# Patient Record
Sex: Male | Born: 1980 | Race: Black or African American | Hispanic: No | Marital: Married | Smoking: Former smoker
Health system: Southern US, Community
[De-identification: ages and names within clinical notes are randomized; demographics above are authoritative.]

## PROBLEM LIST (undated history)

## (undated) DIAGNOSIS — J189 Pneumonia, unspecified organism: Secondary | ICD-10-CM

## (undated) DIAGNOSIS — G4730 Sleep apnea, unspecified: Secondary | ICD-10-CM

## (undated) DIAGNOSIS — R011 Cardiac murmur, unspecified: Secondary | ICD-10-CM

## (undated) DIAGNOSIS — I10 Essential (primary) hypertension: Secondary | ICD-10-CM

## (undated) DIAGNOSIS — E669 Obesity, unspecified: Secondary | ICD-10-CM

## (undated) DIAGNOSIS — Z87891 Personal history of nicotine dependence: Secondary | ICD-10-CM

## (undated) DIAGNOSIS — J45909 Unspecified asthma, uncomplicated: Secondary | ICD-10-CM

## (undated) DIAGNOSIS — M1990 Unspecified osteoarthritis, unspecified site: Secondary | ICD-10-CM

## (undated) HISTORY — PX: NO PAST SURGERIES: SHX2092

---

## 2018-07-17 DIAGNOSIS — H5213 Myopia, bilateral: Secondary | ICD-10-CM | POA: Diagnosis not present

## 2018-12-26 DIAGNOSIS — I10 Essential (primary) hypertension: Secondary | ICD-10-CM | POA: Diagnosis not present

## 2018-12-26 DIAGNOSIS — M79604 Pain in right leg: Secondary | ICD-10-CM | POA: Diagnosis not present

## 2018-12-26 DIAGNOSIS — F17210 Nicotine dependence, cigarettes, uncomplicated: Secondary | ICD-10-CM | POA: Diagnosis not present

## 2018-12-26 DIAGNOSIS — R2241 Localized swelling, mass and lump, right lower limb: Secondary | ICD-10-CM | POA: Diagnosis not present

## 2018-12-26 DIAGNOSIS — Z79899 Other long term (current) drug therapy: Secondary | ICD-10-CM | POA: Diagnosis not present

## 2019-07-24 DIAGNOSIS — H5213 Myopia, bilateral: Secondary | ICD-10-CM | POA: Diagnosis not present

## 2019-12-17 ENCOUNTER — Other Ambulatory Visit: Payer: Self-pay | Admitting: Physician Assistant

## 2019-12-17 ENCOUNTER — Ambulatory Visit
Admission: RE | Admit: 2019-12-17 | Discharge: 2019-12-17 | Disposition: A | Discharge status: Home / Self Care | Payer: 59 | Source: Ambulatory Visit | Attending: Physician Assistant | Admitting: Physician Assistant

## 2019-12-17 DIAGNOSIS — M25562 Pain in left knee: Secondary | ICD-10-CM

## 2019-12-17 DIAGNOSIS — I10 Essential (primary) hypertension: Secondary | ICD-10-CM | POA: Diagnosis not present

## 2019-12-17 DIAGNOSIS — G4730 Sleep apnea, unspecified: Secondary | ICD-10-CM | POA: Diagnosis not present

## 2019-12-17 DIAGNOSIS — M25561 Pain in right knee: Secondary | ICD-10-CM | POA: Diagnosis not present

## 2019-12-17 DIAGNOSIS — M1711 Unilateral primary osteoarthritis, right knee: Secondary | ICD-10-CM | POA: Diagnosis not present

## 2019-12-17 DIAGNOSIS — Z720 Tobacco use: Secondary | ICD-10-CM | POA: Diagnosis not present

## 2019-12-17 DIAGNOSIS — K529 Noninfective gastroenteritis and colitis, unspecified: Secondary | ICD-10-CM | POA: Diagnosis not present

## 2019-12-17 DIAGNOSIS — R7303 Prediabetes: Secondary | ICD-10-CM | POA: Diagnosis not present

## 2019-12-17 DIAGNOSIS — G8929 Other chronic pain: Secondary | ICD-10-CM | POA: Diagnosis not present

## 2019-12-27 DIAGNOSIS — M25551 Pain in right hip: Secondary | ICD-10-CM | POA: Diagnosis not present

## 2019-12-27 DIAGNOSIS — M25561 Pain in right knee: Secondary | ICD-10-CM | POA: Diagnosis not present

## 2020-01-02 DIAGNOSIS — M25561 Pain in right knee: Secondary | ICD-10-CM | POA: Diagnosis not present

## 2020-01-02 DIAGNOSIS — M25551 Pain in right hip: Secondary | ICD-10-CM | POA: Diagnosis not present

## 2020-01-09 DIAGNOSIS — M25551 Pain in right hip: Secondary | ICD-10-CM | POA: Diagnosis not present

## 2020-01-09 DIAGNOSIS — M25561 Pain in right knee: Secondary | ICD-10-CM | POA: Diagnosis not present

## 2020-01-09 DIAGNOSIS — G4733 Obstructive sleep apnea (adult) (pediatric): Secondary | ICD-10-CM | POA: Diagnosis not present

## 2020-01-15 DIAGNOSIS — F17200 Nicotine dependence, unspecified, uncomplicated: Secondary | ICD-10-CM | POA: Diagnosis not present

## 2020-01-15 DIAGNOSIS — Z1389 Encounter for screening for other disorder: Secondary | ICD-10-CM | POA: Diagnosis not present

## 2020-01-15 DIAGNOSIS — I10 Essential (primary) hypertension: Secondary | ICD-10-CM | POA: Diagnosis not present

## 2020-01-15 DIAGNOSIS — K580 Irritable bowel syndrome with diarrhea: Secondary | ICD-10-CM | POA: Diagnosis not present

## 2020-01-15 DIAGNOSIS — G4730 Sleep apnea, unspecified: Secondary | ICD-10-CM | POA: Diagnosis not present

## 2020-01-15 DIAGNOSIS — M1711 Unilateral primary osteoarthritis, right knee: Secondary | ICD-10-CM | POA: Diagnosis not present

## 2020-01-16 DIAGNOSIS — M25561 Pain in right knee: Secondary | ICD-10-CM | POA: Diagnosis not present

## 2020-01-16 DIAGNOSIS — M25551 Pain in right hip: Secondary | ICD-10-CM | POA: Diagnosis not present

## 2020-01-21 DIAGNOSIS — G4733 Obstructive sleep apnea (adult) (pediatric): Secondary | ICD-10-CM | POA: Diagnosis not present

## 2020-01-21 DIAGNOSIS — I10 Essential (primary) hypertension: Secondary | ICD-10-CM | POA: Diagnosis not present

## 2020-02-18 DIAGNOSIS — F17200 Nicotine dependence, unspecified, uncomplicated: Secondary | ICD-10-CM | POA: Diagnosis not present

## 2020-02-18 DIAGNOSIS — I10 Essential (primary) hypertension: Secondary | ICD-10-CM | POA: Diagnosis not present

## 2020-12-15 ENCOUNTER — Other Ambulatory Visit (HOSPITAL_COMMUNITY): Payer: Self-pay | Admitting: Physician Assistant

## 2020-12-15 DIAGNOSIS — F17200 Nicotine dependence, unspecified, uncomplicated: Secondary | ICD-10-CM | POA: Diagnosis not present

## 2020-12-15 DIAGNOSIS — N529 Male erectile dysfunction, unspecified: Secondary | ICD-10-CM | POA: Diagnosis not present

## 2020-12-15 DIAGNOSIS — I10 Essential (primary) hypertension: Secondary | ICD-10-CM | POA: Diagnosis not present

## 2020-12-15 DIAGNOSIS — K580 Irritable bowel syndrome with diarrhea: Secondary | ICD-10-CM | POA: Diagnosis not present

## 2020-12-15 DIAGNOSIS — G4730 Sleep apnea, unspecified: Secondary | ICD-10-CM | POA: Diagnosis not present

## 2021-01-27 IMAGING — DX DG KNEE COMPLETE 4+V*R*
4 series · 4 of 4 positions shown · non-contrast
Comparison: None.

CLINICAL DATA: Right knee pain for 2 weeks

EXAM:
RIGHT KNEE - COMPLETE 4+ VIEW

[dg knee complete 4 views right (1 of 4)]
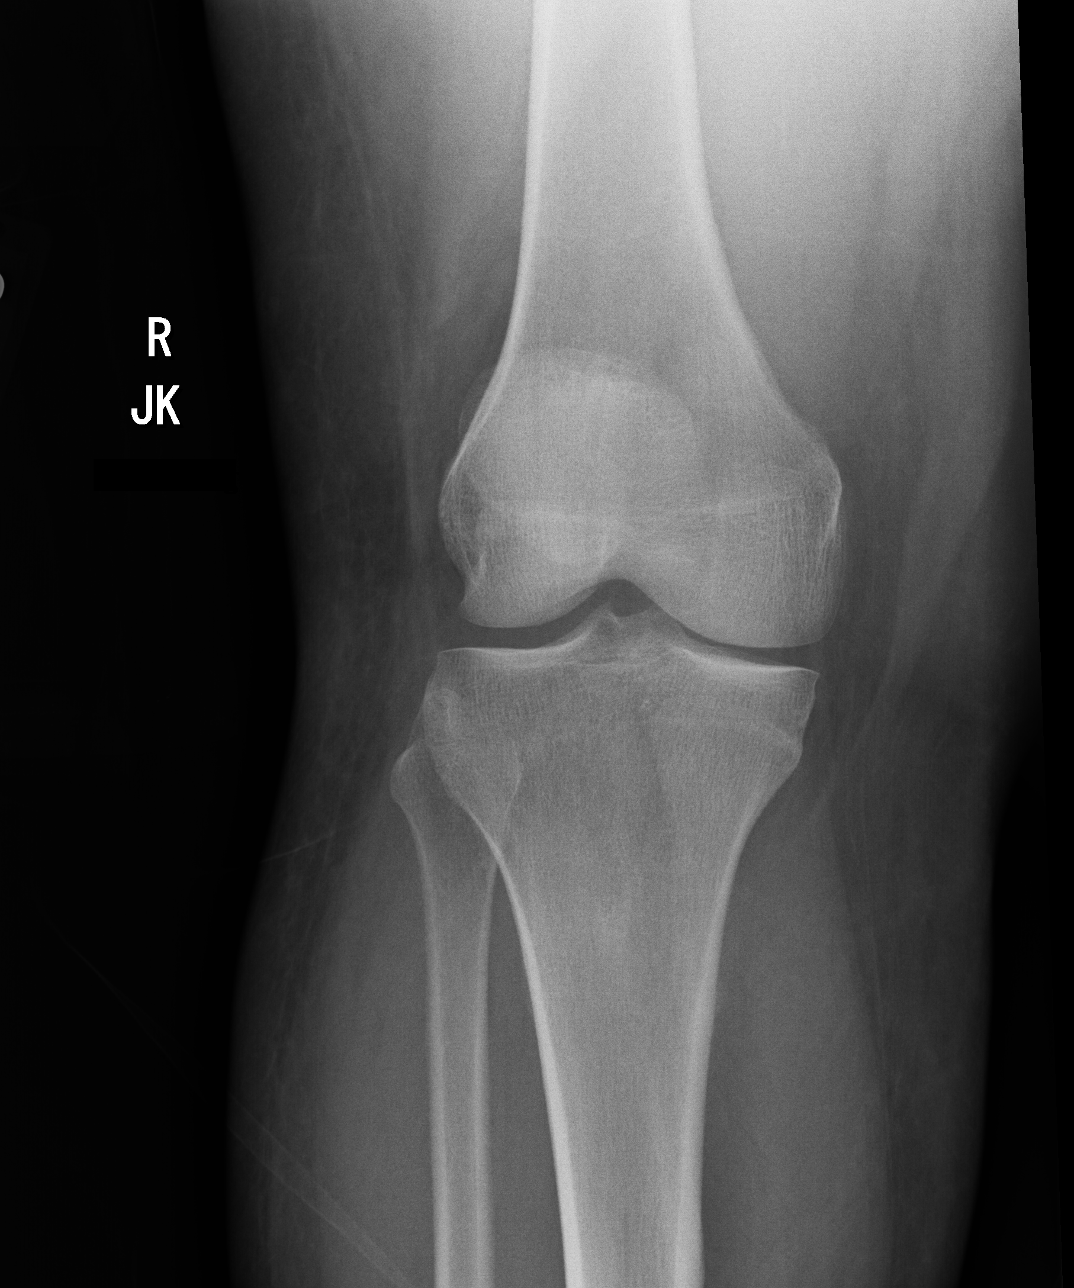

[dg knee complete 4 views right (2 of 4)]
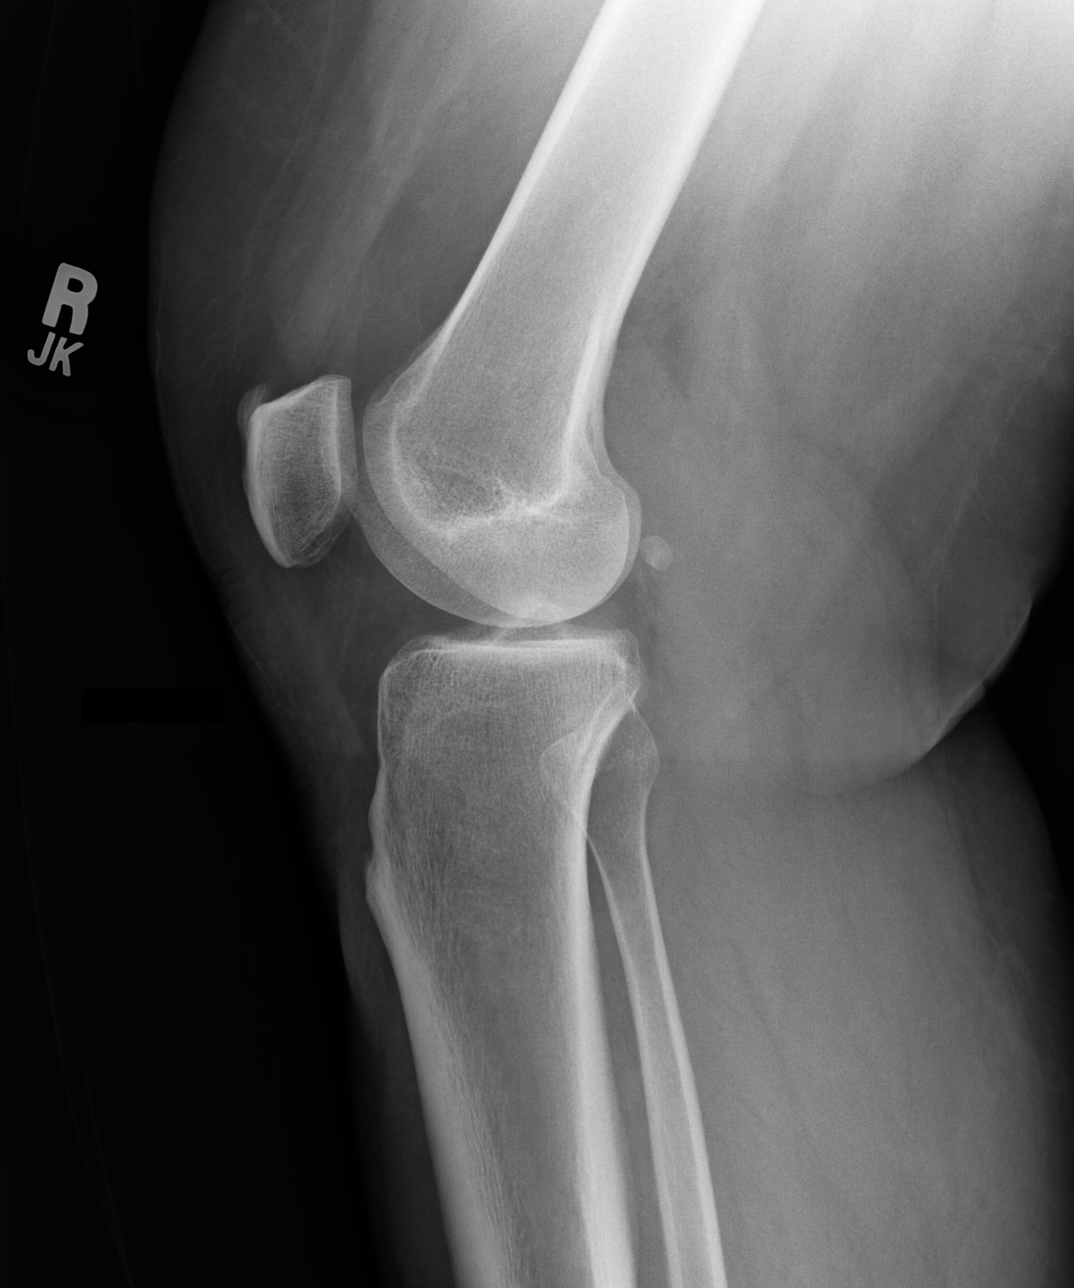

[dg knee complete 4 views right (3 of 4)]
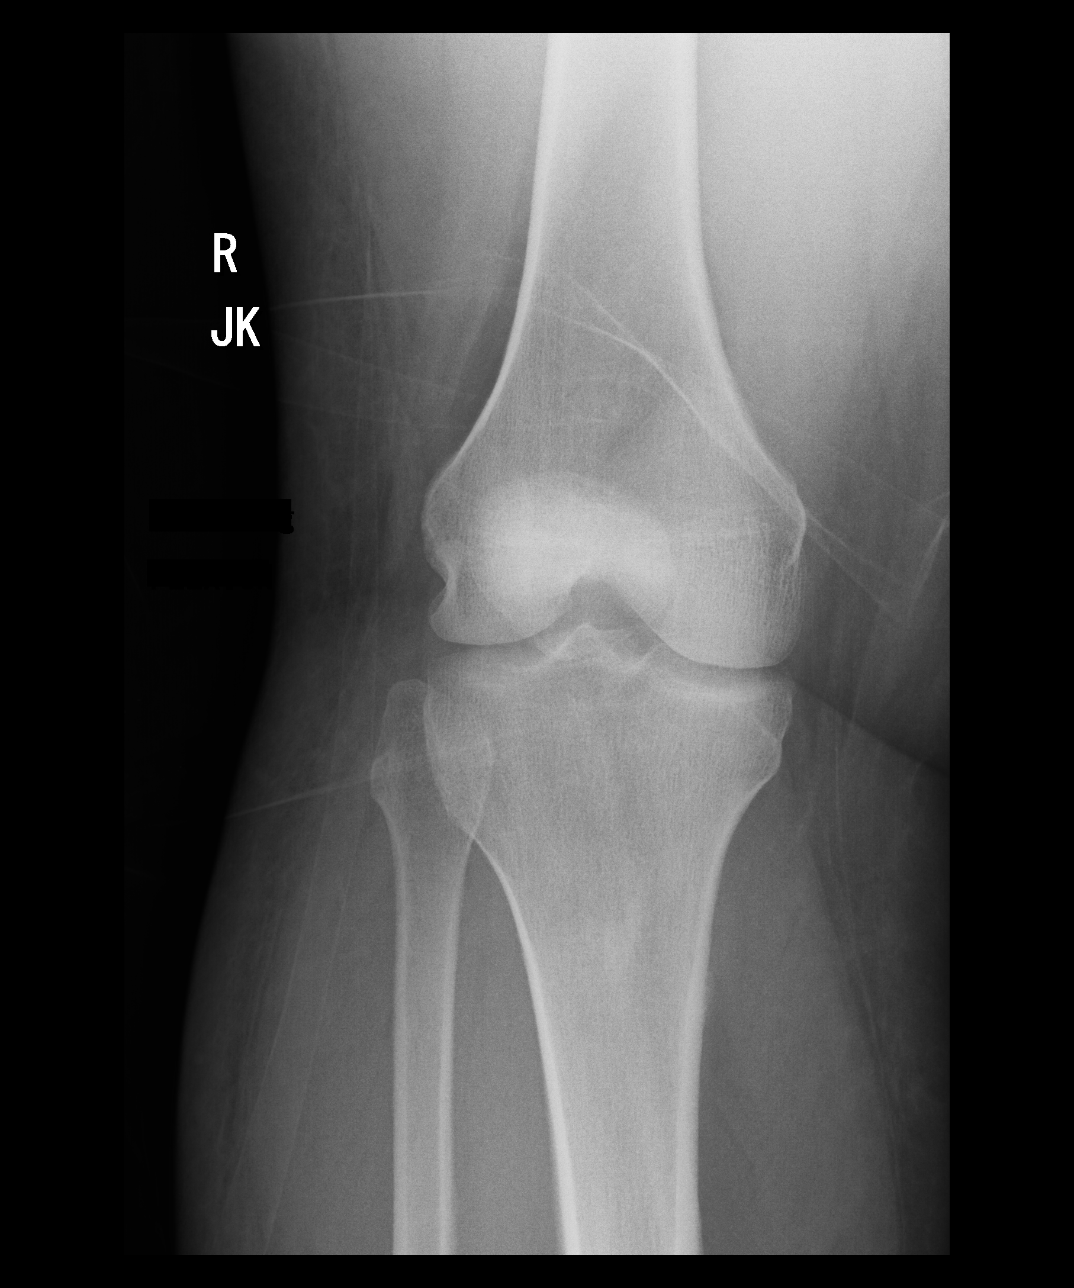

[dg knee complete 4 views right (4 of 4)]
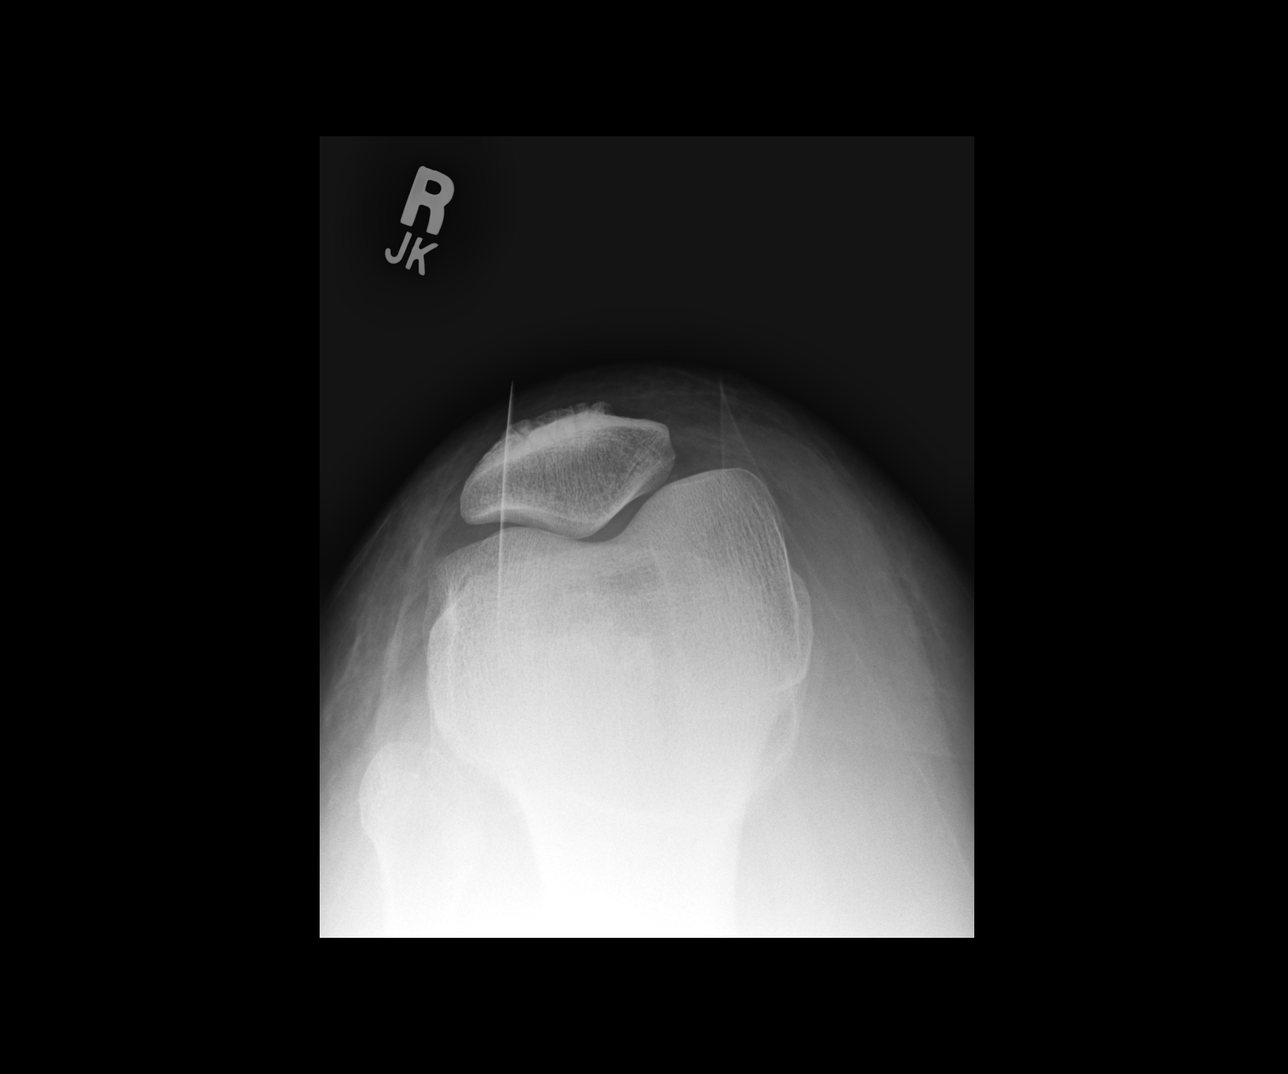

[4 of 4 positions shown; findings below may reference images not displayed]

FINDINGS: Frontal, oblique, lateral, and sunrise views of the right knee are
obtained. No fracture, subluxation, or dislocation. Mild medial
compartmental joint space narrowing. No joint effusion.
IMPRESSION: 1. Mild medial compartmental osteoarthritis.

## 2021-04-09 DIAGNOSIS — G4733 Obstructive sleep apnea (adult) (pediatric): Secondary | ICD-10-CM | POA: Diagnosis not present

## 2022-02-28 ENCOUNTER — Encounter (HOSPITAL_COMMUNITY): Payer: Self-pay

## 2022-02-28 ENCOUNTER — Ambulatory Visit (INDEPENDENT_AMBULATORY_CARE_PROVIDER_SITE_OTHER): Payer: Self-pay

## 2022-02-28 ENCOUNTER — Ambulatory Visit (HOSPITAL_COMMUNITY)
Admission: EM | Admit: 2022-02-28 | Discharge: 2022-02-28 | Disposition: A | Discharge status: Home / Self Care | Payer: Self-pay | Attending: Physician Assistant | Admitting: Physician Assistant

## 2022-02-28 DIAGNOSIS — M545 Low back pain, unspecified: Secondary | ICD-10-CM

## 2022-02-28 DIAGNOSIS — I10 Essential (primary) hypertension: Secondary | ICD-10-CM

## 2022-02-28 MED ORDER — HYDROCHLOROTHIAZIDE 25 MG PO TABS: ORAL_TABLET | ORAL | 1 refills | Status: DC

## 2022-02-28 MED ORDER — TIZANIDINE HCL 4 MG PO TABS: 4.0000 mg | ORAL_TABLET | Freq: Three times a day (TID) | ORAL | 0 refills | Status: DC | PRN
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
## 2022-05-12 ENCOUNTER — Inpatient Hospital Stay (HOSPITAL_BASED_OUTPATIENT_CLINIC_OR_DEPARTMENT_OTHER)
Admission: EM | Admit: 2022-05-12 | Discharge: 2022-05-14 | DRG: 872 | Disposition: A | Discharge status: Home / Self Care | Payer: PRIVATE HEALTH INSURANCE | Attending: Internal Medicine | Admitting: Internal Medicine

## 2022-05-12 ENCOUNTER — Emergency Department (HOSPITAL_BASED_OUTPATIENT_CLINIC_OR_DEPARTMENT_OTHER): Payer: PRIVATE HEALTH INSURANCE

## 2022-05-12 ENCOUNTER — Other Ambulatory Visit: Payer: Self-pay

## 2022-05-12 ENCOUNTER — Encounter (HOSPITAL_BASED_OUTPATIENT_CLINIC_OR_DEPARTMENT_OTHER): Payer: Self-pay | Admitting: Emergency Medicine

## 2022-05-12 DIAGNOSIS — M1990 Unspecified osteoarthritis, unspecified site: Secondary | ICD-10-CM | POA: Diagnosis present

## 2022-05-12 DIAGNOSIS — K612 Anorectal abscess: Secondary | ICD-10-CM | POA: Diagnosis present

## 2022-05-12 DIAGNOSIS — G4733 Obstructive sleep apnea (adult) (pediatric): Secondary | ICD-10-CM | POA: Diagnosis present

## 2022-05-12 DIAGNOSIS — E876 Hypokalemia: Secondary | ICD-10-CM | POA: Diagnosis present

## 2022-05-12 DIAGNOSIS — K610 Anal abscess: Secondary | ICD-10-CM | POA: Diagnosis present

## 2022-05-12 DIAGNOSIS — Z79899 Other long term (current) drug therapy: Secondary | ICD-10-CM | POA: Diagnosis not present

## 2022-05-12 DIAGNOSIS — I10 Essential (primary) hypertension: Secondary | ICD-10-CM | POA: Diagnosis present

## 2022-05-12 DIAGNOSIS — Z6841 Body Mass Index (BMI) 40.0 and over, adult: Secondary | ICD-10-CM

## 2022-05-12 DIAGNOSIS — K6289 Other specified diseases of anus and rectum: Secondary | ICD-10-CM | POA: Diagnosis not present

## 2022-05-12 DIAGNOSIS — A419 Sepsis, unspecified organism: Secondary | ICD-10-CM | POA: Diagnosis present

## 2022-05-12 DIAGNOSIS — R7303 Prediabetes: Secondary | ICD-10-CM | POA: Diagnosis present

## 2022-05-12 DIAGNOSIS — F17210 Nicotine dependence, cigarettes, uncomplicated: Secondary | ICD-10-CM | POA: Diagnosis present

## 2022-05-12 HISTORY — DX: Personal history of nicotine dependence: Z87.891

## 2022-05-12 HISTORY — DX: Unspecified osteoarthritis, unspecified site: M19.90

## 2022-05-12 HISTORY — DX: Obesity, unspecified: E66.9

## 2022-05-12 HISTORY — DX: Sleep apnea, unspecified: G47.30

## 2022-05-12 HISTORY — DX: Essential (primary) hypertension: I10

## 2022-05-12 LAB — CBC WITH DIFFERENTIAL/PLATELET
Abs Immature Granulocytes: 0.1 10*3/uL — ABNORMAL HIGH (ref 0.00–0.07)
Basophils Absolute: 0 10*3/uL (ref 0.0–0.1)
Basophils Relative: 0 %
Eosinophils Absolute: 0 10*3/uL (ref 0.0–0.5)
Eosinophils Relative: 0 %
HCT: 44.3 % (ref 39.0–52.0)
Hemoglobin: 14.8 g/dL (ref 13.0–17.0)
Immature Granulocytes: 1 %
Lymphocytes Relative: 13 %
Lymphs Abs: 2.2 10*3/uL (ref 0.7–4.0)
MCH: 30.1 pg (ref 26.0–34.0)
MCHC: 33.4 g/dL (ref 30.0–36.0)
MCV: 90.2 fL (ref 80.0–100.0)
Monocytes Absolute: 2.2 10*3/uL — ABNORMAL HIGH (ref 0.1–1.0)
Monocytes Relative: 13 %
Neutro Abs: 11.7 10*3/uL — ABNORMAL HIGH (ref 1.7–7.7)
Neutrophils Relative %: 73 %
Platelets: 353 10*3/uL (ref 150–400)
RBC: 4.91 MIL/uL (ref 4.22–5.81)
RDW: 13.5 % (ref 11.5–15.5)
WBC: 16.3 10*3/uL — ABNORMAL HIGH (ref 4.0–10.5)
nRBC: 0 % (ref 0.0–0.2)

## 2022-05-12 LAB — COMPREHENSIVE METABOLIC PANEL
ALT: 33 U/L (ref 0–44)
AST: 17 U/L (ref 15–41)
Albumin: 4.5 g/dL (ref 3.5–5.0)
Alkaline Phosphatase: 74 U/L (ref 38–126)
Anion gap: 12 (ref 5–15)
BUN: 9 mg/dL (ref 6–20)
CO2: 27 mmol/L (ref 22–32)
Calcium: 9.8 mg/dL (ref 8.9–10.3)
Chloride: 100 mmol/L (ref 98–111)
Creatinine, Ser: 0.74 mg/dL (ref 0.61–1.24)
GFR, Estimated: >60 mL/min (ref >60)
Glucose, Bld: 98 mg/dL (ref 70–99)
Potassium: 3.1 mmol/L — ABNORMAL LOW (ref 3.5–5.1)
Sodium: 139 mmol/L (ref 135–145)
Total Bilirubin: 0.5 mg/dL (ref 0.3–1.2)
Total Protein: 8.3 g/dL — ABNORMAL HIGH (ref 6.5–8.1)

## 2022-05-12 LAB — LACTIC ACID, PLASMA: Lactic Acid, Venous: 0.9 mmol/L (ref 0.5–1.9)

## 2022-05-12 MED ORDER — SODIUM CHLORIDE 0.9 % IV BOLUS
500.0000 mL | Freq: Once | INTRAVENOUS | Status: AC
  Administered 2022-05-12: 500 mL via INTRAVENOUS

## 2022-05-12 MED ORDER — LIDOCAINE-EPINEPHRINE (PF) 2 %-1:200000 IJ SOLN
20.0000 mL | Freq: Once | INTRAMUSCULAR | Status: AC
  Administered 2022-05-12: 20 mL
  Filled 2022-05-12: qty 20

## 2022-05-12 MED ORDER — SODIUM CHLORIDE 0.9 % IV SOLN
INTRAVENOUS | Status: DC | PRN
  Administered 2022-05-12: 10 mL/h via INTRAVENOUS

## 2022-05-12 MED ORDER — ONDANSETRON HCL 4 MG/2ML IJ SOLN
4.0000 mg | Freq: Once | INTRAMUSCULAR | Status: AC
  Administered 2022-05-12: 4 mg via INTRAVENOUS
  Filled 2022-05-12: qty 2

## 2022-05-12 MED ORDER — IOHEXOL 300 MG/ML  SOLN
100.0000 mL | Freq: Once | INTRAMUSCULAR | Status: AC | PRN
  Administered 2022-05-12: 100 mL via INTRAVENOUS

## 2022-05-12 MED ORDER — PIPERACILLIN-TAZOBACTAM 3.375 G IVPB
3.3750 g | Freq: Three times a day (TID) | INTRAVENOUS | Status: DC
  Administered 2022-05-12 – 2022-05-14 (×5): 3.375 g via INTRAVENOUS
  Filled 2022-05-12 (×5): qty 50

## 2022-05-12 MED ORDER — ONDANSETRON HCL 4 MG/2ML IJ SOLN: 4.0000 mg | Freq: Four times a day (QID) | INTRAMUSCULAR | Status: DC | PRN

## 2022-05-12 MED ORDER — ONDANSETRON HCL 4 MG PO TABS: 4.0000 mg | ORAL_TABLET | Freq: Four times a day (QID) | ORAL | Status: DC | PRN

## 2022-05-12 MED ORDER — MORPHINE SULFATE (PF) 4 MG/ML IV SOLN
4.0000 mg | Freq: Once | INTRAVENOUS | Status: AC
  Administered 2022-05-12: 4 mg via INTRAVENOUS
  Filled 2022-05-12: qty 1

## 2022-05-12 MED ORDER — IOHEXOL 350 MG/ML SOLN
100.0000 mL | Freq: Once | INTRAVENOUS | Status: DC | PRN
Start: 2022-05-12 — End: 2022-05-12

## 2022-05-12 MED ORDER — LACTATED RINGERS IV SOLN: INTRAVENOUS | Status: DC

## 2022-05-12 MED ORDER — ACETAMINOPHEN 650 MG RE SUPP: 650.0000 mg | Freq: Four times a day (QID) | RECTAL | Status: DC | PRN

## 2022-05-12 MED ORDER — ACETAMINOPHEN 325 MG PO TABS: 650.0000 mg | ORAL_TABLET | Freq: Four times a day (QID) | ORAL | Status: DC | PRN

## 2022-05-12 MED ORDER — POTASSIUM CHLORIDE 10 MEQ/100ML IV SOLN
10.0000 meq | INTRAVENOUS | Status: AC
  Administered 2022-05-12 (×3): 10 meq via INTRAVENOUS
  Filled 2022-05-12 (×3): qty 100

## 2022-05-12 MED ORDER — PIPERACILLIN-TAZOBACTAM 3.375 G IVPB 30 MIN
3.3750 g | Freq: Once | INTRAVENOUS | Status: AC
Start: 2022-05-12 — End: 2022-05-12
  Administered 2022-05-12: 3.375 g via INTRAVENOUS
  Filled 2022-05-12: qty 50

## 2022-05-12 MED ORDER — ENOXAPARIN SODIUM 40 MG/0.4ML IJ SOSY
40.0000 mg | PREFILLED_SYRINGE | INTRAMUSCULAR | Status: DC
  Administered 2022-05-13: 40 mg via SUBCUTANEOUS
  Filled 2022-05-12: qty 0.4

## 2022-05-12 NOTE — Assessment & Plan Note (Signed)
BPs 110s systolic currently. Hold home BPs for the moment.

## 2022-05-12 NOTE — Progress Notes (Signed)
Patient arrived from [place]. Patient is resting well and pain in controlled.

## 2022-05-12 NOTE — ED Provider Notes (Signed)
Emergency Department Provider Note   I have reviewed the triage vital signs and the nursing notes.   HISTORY  Chief Complaint Abscess   HPI Johnathan Lee is a 41 y.o. male presents emergency department for evaluation and concern for perirectal abscess from his PCP.  Patient tells me has had discomfort in the rectal area for the past 3 weeks.  He assumed this was a hemorrhoid and he been treating with over-the-counter medications and overall felt like he was improving until the last couple of days when he developed sudden worsening pain, inflammation, drainage.  No bleeding.  He continues to have bowel movements.  He had subjective fevers and chills.  He went to his PCP who evaluated and had concern for perirectal abscess.  He was unable to be seen same day of the general surgery clinic and so was referred to the emergency department for evaluation.  He denies any anterior abdominal pain.  Not having worsening pain into the left buttock and perirectal area.    Past Medical History:  Diagnosis Date   Former smoker    Hypertension    Obesity    Osteoarthritis    Sleep apnea     Review of Systems  Constitutional: Positive fever/chills Cardiovascular: Denies chest pain. Respiratory: Denies shortness of breath. Gastrointestinal: No abdominal pain.  No nausea, no vomiting.  No diarrhea.  No constipation. Positive rectal pain and drainage.  Genitourinary: Negative for dysuria. Musculoskeletal: Negative for back pain. Skin: Negative for rash. Neurological: Negative for headaches, focal weakness or numbness.   ____________________________________________   PHYSICAL EXAM:  VITAL SIGNS: ED Triage Vitals  Enc Vitals Group     BP 05/12/22 1224 (!) 159/96     Pulse Rate 05/12/22 1221 (!) 111     Resp 05/12/22 1221 20     Temp 05/12/22 1221 99.9 F (37.7 C)     Temp Source 05/12/22 1221 Oral     SpO2 05/12/22 1221 99 %     Weight 05/12/22 1222 (!) 323 lb (146.5 kg)      Height 05/12/22 1222 5\' 5"  (1.651 m)   Constitutional: Alert and oriented. Well appearing and in no acute distress. Eyes: Conjunctivae are normal.  Head: Atraumatic. Nose: No congestion/rhinnorhea. Mouth/Throat: Mucous membranes are moist.  Neck: No stridor.   Cardiovascular: Tachycardia. Good peripheral circulation. Grossly normal heart sounds.   Respiratory: Normal respiratory effort.  No retractions. Lungs CTAB. Gastrointestinal: Soft and nontender. No distention.  Patient with focal area of swelling and induration superior to the anus.  This is fluctuant with some purulence draining along the superior aspect of this area.  There is induration surrounding this and extending into the left gluteus.  Musculoskeletal: No lower extremity tenderness nor edema. No gross deformities of extremities. Neurologic:  Normal speech and language. No gross focal neurologic deficits are appreciated.  Skin:  Skin is warm and dry. Perianal abscess noted.   ____________________________________________   LABS (all labs ordered are listed, but only abnormal results are displayed)  Labs Reviewed  COMPREHENSIVE METABOLIC PANEL - Abnormal; Notable for the following components:      Result Value   Potassium 3.1 (*)    Total Protein 8.3 (*)    All other components within normal limits  CBC WITH DIFFERENTIAL/PLATELET - Abnormal; Notable for the following components:   WBC 16.3 (*)    Neutro Abs 11.7 (*)    Monocytes Absolute 2.2 (*)    Abs Immature Granulocytes 0.10 (*)    All  other components within normal limits  CULTURE, BLOOD (ROUTINE X 2)  CULTURE, BLOOD (ROUTINE X 2)  LACTIC ACID, PLASMA   ____________________________________________  RADIOLOGY  CT PELVIS W CONTRAST  Result Date: 05/12/2022 CLINICAL DATA:  A 41 year old male presents with potential perianal abscess or fistula. EXAM: CT PELVIS WITH CONTRAST TECHNIQUE: Multidetector CT imaging of the pelvis was performed using the standard  protocol following the bolus administration of intravenous contrast. RADIATION DOSE REDUCTION: This exam was performed according to the departmental dose-optimization program which includes automated exposure control, adjustment of the mA and/or kV according to patient size and/or use of iterative reconstruction technique. CONTRAST:  OMNIPAQUE IOHEXOL 300 MG/ML  SOLN COMPARISON:  None available. FINDINGS: Urinary Tract: Urinary bladder with smooth contours and without signs of perivesical inflammation. No distal ureteral dilation. Bowel: Gas and fluid containing collection measuring 4 x 1 cm appears to extend from the midportion to lower portion of the sphincter complex and is associated with abundant surrounding stranding. There is a 1 cm area on image 37/2 as well that may be contiguous with this area along the lower margin of the levator plate. Inflammation tracks along the lower margin of the pelvic floor. The dominant collection above could even be trapped between the gluteal folds though could be beneath the skin surface on the LEFT, difficult to determine based on CT imaging and coaptation of gluteal folds in the perianal region. Mild thickening of the distal rectum. No sign of bowel inflammation otherwise to the extent evaluated on this pelvic CT. Vascular/Lymphatic: No adenopathy in the pelvis, scattered small lymph nodes are presumably reactive. Vascular structures grossly patent on venous phase. Reproductive:  Unremarkable to the extent evaluated by CT. Other: Inflammation tracking throughout the LEFT gluteal fat with fat stranding extending into the LEFT inferior gluteal region and cephalad towards the upper margin of the gluteal cleft. Musculoskeletal: No acute bone finding or destructive bone process. IMPRESSION: 1. 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 2. Some of the gas in fluid could also be trapped within the gluteal cleft on the current study though suspect much of the loculated collection resides in the LEFT gluteal fold. Correlate with direct clinical inspection. Findings could be further evaluated as warranted with MRI utilizing fistula protocol. Ultimately colorectal surgical evaluation may be helpful. Electronically Signed   By: Donzetta Kohut M.D.   On: 05/12/2022 16:58    ____________________________________________   PROCEDURES  Procedure(s) performed:   .Critical Care  Performed by: Maia Plan, MD Authorized by: Maia Plan, MD   Critical care provider statement:    Critical care time (minutes):  35   Critical care time was exclusive of:  Separately billable procedures and treating other patients and teaching time   Critical care was necessary to treat or prevent imminent or life-threatening deterioration of the following conditions:  Sepsis   Critical care was time spent personally by me on the following activities:  Development of treatment plan with patient or surrogate, discussions with consultants, evaluation of patient's response to treatment, examination of patient, ordering and review of laboratory studies, ordering and review of radiographic studies, ordering and performing treatments and interventions, pulse oximetry, re-evaluation of patient's condition, review of old charts and obtaining history from patient or surrogate   I assumed  direction of critical care for this patient from another provider in my specialty: no     Care discussed with: admitting provider   .Marland KitchenIncision and Drainage  Date/Time: 05/12/2022 7:12 PM  Performed by: Maia Plan, MD Authorized by: Maia Plan, MD   Consent:    Consent obtained:  Verbal   Risks, benefits, and alternatives were discussed: yes     Risks  discussed:  Bleeding, damage to other organs, infection, incomplete drainage and pain   Alternatives discussed:  No treatment Universal protocol:    Patient identity confirmed:  Verbally with patient Location:    Type:  Abscess   Size:  5 cm   Location:  Anogenital   Anogenital location:  Perianal Pre-procedure details:    Skin preparation:  Povidone-iodine Sedation:    Sedation type:  None Anesthesia:    Anesthesia method:  Local infiltration   Local anesthetic:  Lidocaine 2% WITH epi Procedure type:    Complexity:  Complex Procedure details:    Ultrasound guidance: no     Needle aspiration: no     Incision types:  Single straight   Incision depth:  Subcutaneous   Wound management:  Probed and deloculated   Drainage:  Purulent   Drainage amount:  Copious   Wound treatment:  Wound left open   Packing materials:  1/4 in iodoform gauze   Amount 1/4" iodoform:  15 cm Post-procedure details:    Procedure completion:  Tolerated well, no immediate complications    ____________________________________________   INITIAL IMPRESSION / ASSESSMENT AND PLAN / ED COURSE  Pertinent labs & imaging results that were available during my care of the patient were reviewed by me and considered in my medical decision making (see chart for details).   This patient is Presenting for Evaluation of rectal pain/fever, which does require a range of treatment options, and is a complaint that involves a high risk of morbidity and mortality.  The Differential Diagnoses include gluteal abscess, perianal abscess, perirectal abscess, developing sepsis.  Critical Interventions-    Medications  0.9 %  sodium chloride infusion (0 mLs Intravenous Stopped 05/12/22 1852)  lactated ringers infusion ( Intravenous New Bag/Given 05/12/22 1850)  potassium chloride 10 mEq in 100 mL IVPB (10 mEq Intravenous New Bag/Given 05/12/22 1851)  sodium chloride 0.9 % bolus 500 mL (0 mLs Intravenous Stopped 05/12/22 1705)   morphine (PF) 4 MG/ML injection 4 mg (4 mg Intravenous Given 05/12/22 1547)  ondansetron (ZOFRAN) injection 4 mg (4 mg Intravenous Given 05/12/22 1547)  iohexol (OMNIPAQUE) 300 MG/ML solution 100 mL (100 mLs Intravenous Contrast Given 05/12/22 1633)  lidocaine-EPINEPHrine (XYLOCAINE W/EPI) 2 %-1:200000 (PF) injection 20 mL (20 mLs Infiltration Given 05/12/22 1745)  piperacillin-tazobactam (ZOSYN) IVPB 3.375 g (0 g Intravenous Stopped 05/12/22 1849)    Reassessment after intervention: Symptoms improved.   I decided to review pertinent External Data, and in summary paper records from [facility] at bedside reviewed.   Clinical Laboratory Tests Ordered, included patient with leukocytosis to 16.3.  No acute kidney injury.  Potassium 3.1.  Lactic acid normal.  Blood cultures sent.  Radiologic Tests Ordered, included CT pelvis. I independently interpreted the images and agree with radiology interpretation.   Cardiac Monitor Tracing which shows sinus tachycardia.   Social Determinants of Health Risk patient with smoking history.   Consult complete with General Surgery (Dr. Dossie Der). Agrees with bedside I&D and can consult on the patient under care of [facility].   Hospitalist (Dr. Adela Glimpse) with plan for  admit.   Medical Decision Making: Summary:  Patient presents to the emergency department with rectal pain, fever, tachycardia.  He has a normal lactic acid but elevated white blood cell count.  After imaging and discussion with general surgery I did at bedside incision and drainage with packing to obtain better source control.  No feculent material but large amount of purulent discharge.   Reevaluation with update and discussion with patient. Plan for IVF and admit.   Disposition: admit  ____________________________________________  FINAL CLINICAL IMPRESSION(S) / ED DIAGNOSES  Final diagnoses:  Perianal abscess   Note:  This document was prepared using Dragon voice recognition software and may  include unintentional dictation errors.  Alona Bene, MD, [facility] Emergency Medicine    Sherida Dobkins, Arlyss Repress, MD 05/12/22 [phone]

## 2022-05-12 NOTE — ED Notes (Signed)
Patient transported to CT 

## 2022-05-12 NOTE — Progress Notes (Signed)
Pharmacy Antibiotic Note  Johnathan Lee is a 41 y.o. male admitted on 05/12/2022 presenting with perirectal abscess.  Pharmacy has been consulted for zosyn dosing.  Plan: Zosyn 3.375g IV every 8 hours (extended 4h infusion) Monitor renal function, clinical progression and LOT  Height: 5\' 5"  (165.1 cm) Weight: (!) 146.5 kg (323 lb) IBW/kg (Calculated) : 61.5  Temp (24hrs), Avg:99.4 F (37.4 C), Min:98.8 F (37.1 C), Max:99.9 F (37.7 C)  Recent Labs  Lab 05/12/22 1540  WBC 16.3*  CREATININE 0.74  LATICACIDVEN 0.9    Estimated Creatinine Clearance: 160.8 mL/min (by C-G formula based on SCr of 0.74 mg/dL).    No Known Allergies  07/13/22, PharmD Clinical Pharmacist ED Pharmacist Phone # [phone] 05/12/2022 10:07 PM

## 2022-05-12 NOTE — ED Triage Notes (Signed)
Pt arrives to ED with c/o perirectal abscess. Pt reports hemorrhoids x3 weeks with worsening pain yesterday. Pt reports he PCP dx him with a perirectal abscess and referred him to the ED since surgery could not see him today. Associated symptoms include fever, tachycardia.

## 2022-05-12 NOTE — ED Notes (Signed)
Pt awake and alert lying in bed  on cellphone talking.  No acute distress noted.  LR maintenance infusion @100ml /hr with 1st of 3 bags ordered of K+ infusing togeteher to 20G L AC; dressing dry and intact- site free of complications.  Cardiac and pulse ox monitoring maintained.  Pt updated on room assignment at [facility] and remains agreeable with plan for xfer.  Denies any immediate needs at this time.   Will monitor for acute changes and maintain plan of care.

## 2022-05-12 NOTE — ED Notes (Signed)
First attempt to call report- advised receiving nurse in report with dayshift -- receiving nurse to call for report when available.

## 2022-05-12 NOTE — Assessment & Plan Note (Signed)
3 runs IV K in ED, repeat BMP in AM.

## 2022-05-12 NOTE — Assessment & Plan Note (Addendum)
S/p I+D by EDP. 1. Continue empiric zosyn for the moment 2. Check MRSA PCR nares 3. Gen surg consult - Though I+D already performed by EDP 4. IVF: LR at 100 5. Will order wound culture (not sure if this was obtained with I+D or not). 6. BCx pending

## 2022-05-12 NOTE — H&P (Signed)
History and Physical    Patient: Johnathan Lee WCB:[id number] DOB: [birth year] DOA: 05/12/2022 DOS: the patient was seen and examined on 05/12/2022 PCP: Delma Officer, PA  Patient coming from: Home  Chief Complaint:  Chief Complaint  Patient presents with   Abscess   HPI: Johnathan Lee is a 41 y.o. male with medical history significant of Obesity, OSA, HTN.  Pt presents to ED with c/o perirectal abscess from PCP.  Pt with discomfort in rectal area for past 3 weeks, assumed this was hemorrhoid and used OTC meds.  Felt like it was improving until last couple of days when he had sudden worsening of pain, inflamation, drainage.  No bleeding.  Continues to have BMs.  Has subjective fever and chills.  Went to PCP who was concerned for perirectal abscess.  Gen surg unable to see same day so pt sent in to ED.  In ED, abscess confirmed, started on zosyn and I+D performed.   Review of Systems: As mentioned in the history of present illness. All other systems reviewed and are negative. Past Medical History:  Diagnosis Date   Former smoker    Hypertension    Obesity    Osteoarthritis    Sleep apnea    History reviewed. No pertinent surgical history. Social History:  reports that he has been smoking cigarettes and cigars. He has never used smokeless tobacco. He reports that he does not currently use alcohol. No history on file for drug use.  No Known Allergies  History reviewed. No pertinent family history.  Prior to Admission medications   Medication Sig Start Date End Date Taking? Authorizing Provider  amLODipine (NORVASC) 5 MG tablet Take 5 mg by mouth daily.   Yes [provider]  buPROPion ER (WELLBUTRIN SR) 100 MG 12 hr tablet Take 100 mg by mouth 2 (two) times daily.   Yes [provider]  hydrochlorothiazide (HYDRODIURIL) 25 MG tablet TAKE 1 TABLET BY MOUTH ONCE A DAY IN THE MORNING Patient taking differently: Take 25 mg by mouth daily. 02/28/22 02/28/23  Yes Raspet, Erin K, PA-C  losartan (COZAAR) 50 MG tablet Take 50 mg by mouth daily.   Yes [provider]  tiZANidine (ZANAFLEX) 4 MG tablet Take 1 tablet (4 mg total) by mouth every 8 (eight) hours as needed for muscle spasms. Patient not taking: Reported on 05/12/2022 02/28/22   Jeani Hawking, PA-C    Physical Exam: Vitals:   05/12/22 1919 05/12/22 1930 05/12/22 2000 05/12/22 2315  BP:   (!) 149/97 118/74  Pulse: (!) 102  (!) 102 (!) 103  Resp: 20  20 19   Temp: 99.5 F (37.5 C)  98.8 F (37.1 C) 99.6 F (37.6 C)  TempSrc: Oral  Oral Oral  SpO2: 99% 97% 97% 97%  Weight:      Height:       Constitutional: NAD, calm, comfortable Eyes: PERRL, lids and conjunctivae normal ENMT: Mucous membranes are moist. Posterior pharynx clear of any exudate or lesions.Normal dentition.  Neck: normal, supple, no masses, no thyromegaly Respiratory: clear to auscultation bilaterally, no wheezing, no crackles. Normal respiratory effort. No accessory muscle use.  Cardiovascular: Regular rate and rhythm, no murmurs / rubs / gallops. No extremity edema. 2+ pedal pulses. No carotid bruits.  Abdomen: no tenderness, no masses palpated. No hepatosplenomegaly. Bowel sounds positive.  Musculoskeletal: no clubbing / cyanosis. No joint deformity upper and lower extremities. Good ROM, no contractures. Normal muscle tone.  Skin: no rashes, lesions, ulcers. No induration Neurologic:  CN 2-12 grossly intact. Sensation intact, DTR normal. Strength 5/5 in all 4.  Psychiatric: Normal judgment and insight. Alert and oriented x 3. Normal mood.   Data Reviewed:    CBC    Component Value Date/Time   WBC 16.3 (H) 05/12/2022 1540   RBC 4.91 05/12/2022 1540   HGB 14.8 05/12/2022 1540   HCT 44.3 05/12/2022 1540   PLT 353 05/12/2022 1540   MCV 90.2 05/12/2022 1540   MCH 30.1 05/12/2022 1540   MCHC 33.4 05/12/2022 1540   RDW 13.5 05/12/2022 1540   LYMPHSABS 2.2 05/12/2022 1540   MONOABS 2.2 (H) 05/12/2022 1540    EOSABS 0.0 05/12/2022 1540   BASOSABS 0.0 05/12/2022 1540      Latest Ref Rng & Units 05/12/2022    3:40 PM  BMP  Glucose 70 - 99 mg/dL 98   BUN 6 - 20 mg/dL 9   Creatinine 3.41 - 9.62 mg/dL 2.29   Sodium 798 - 921 mmol/L 139   Potassium 3.5 - 5.1 mmol/L 3.1   Chloride 98 - 111 mmol/L 100   CO2 22 - 32 mmol/L 27   Calcium 8.9 - 10.3 mg/dL 9.8    CT AP: IMPRESSION: 1. 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 2. Some of the gas in fluid could also be trapped within the gluteal cleft on the current study though suspect much of the loculated collection resides in the LEFT gluteal fold. Correlate with direct clinical inspection. Findings could be further evaluated as warranted with MRI utilizing fistula protocol. Ultimately colorectal surgical evaluation may be helpful.  Assessment and Plan: * Perianal abscess S/p I+D by EDP. Continue empiric zosyn for the moment Check MRSA PCR nares Gen surg consult - Though I+D already performed by EDP IVF: LR at 100 Will order wound culture (not sure if this was obtained with I+D or not). BCx pending  HTN (hypertension) BPs 110s systolic currently. Hold home BPs for the moment.  Hypokalemia 3 runs IV K in ED, repeat BMP in AM.      Advance Care Planning:   Code Status: Full Code  Consults: Gen surg  Family Communication: No family in room  Severity of Illness: The appropriate patient status for this patient is INPATIENT. Inpatient status is judged to be reasonable and necessary in order to provide the required intensity of service to ensure the patient's safety. The patient's presenting symptoms, physical exam findings, and initial radiographic and laboratory data in the  context of their chronic comorbidities is felt to place them at high risk for further clinical deterioration. Furthermore, it is not anticipated that the patient will be medically stable for discharge from the hospital within 2 midnights of admission.   * I certify that at the point of admission it is my clinical judgment that the patient will require inpatient hospital care spanning beyond 2 midnights from the point of admission due to high intensity of service, high risk for further deterioration and high frequency of surveillance required.*  Author: Hillary Bow., DO 05/12/2022 11:33 PM  For on call review [email or web address].

## 2022-05-13 LAB — BASIC METABOLIC PANEL
Anion gap: 6 (ref 5–15)
BUN: 8 mg/dL (ref 6–20)
CO2: 27 mmol/L (ref 22–32)
Calcium: 8.8 mg/dL — ABNORMAL LOW (ref 8.9–10.3)
Chloride: 105 mmol/L (ref 98–111)
Creatinine, Ser: 0.91 mg/dL (ref 0.61–1.24)
GFR, Estimated: >60 mL/min (ref >60)
Glucose, Bld: 148 mg/dL — ABNORMAL HIGH (ref 70–99)
Potassium: 3 mmol/L — ABNORMAL LOW (ref 3.5–5.1)
Sodium: 138 mmol/L (ref 135–145)

## 2022-05-13 LAB — CBC
HCT: 41.8 % (ref 39.0–52.0)
Hemoglobin: 13.7 g/dL (ref 13.0–17.0)
MCH: 30.3 pg (ref 26.0–34.0)
MCHC: 32.8 g/dL (ref 30.0–36.0)
MCV: 92.5 fL (ref 80.0–100.0)
Platelets: 313 10*3/uL (ref 150–400)
RBC: 4.52 MIL/uL (ref 4.22–5.81)
RDW: 13.7 % (ref 11.5–15.5)
WBC: 16.8 10*3/uL — ABNORMAL HIGH (ref 4.0–10.5)
nRBC: 0 % (ref 0.0–0.2)

## 2022-05-13 LAB — MRSA NEXT GEN BY PCR, NASAL: MRSA by PCR Next Gen: NOT DETECTED

## 2022-05-13 LAB — HEMOGLOBIN A1C
Hgb A1c MFr Bld: 6.1 % — ABNORMAL HIGH (ref 4.8–5.6)
Mean Plasma Glucose: 128.37 mg/dL

## 2022-05-13 LAB — MAGNESIUM: Magnesium: 2.3 mg/dL (ref 1.7–2.4)

## 2022-05-13 LAB — HIV ANTIBODY (ROUTINE TESTING W REFLEX): HIV Screen 4th Generation wRfx: NONREACTIVE

## 2022-05-13 MED ORDER — ENOXAPARIN SODIUM 80 MG/0.8ML IJ SOSY
70.0000 mg | PREFILLED_SYRINGE | INTRAMUSCULAR | Status: DC
  Administered 2022-05-14: 70 mg via SUBCUTANEOUS
  Filled 2022-05-13: qty 0.8

## 2022-05-13 MED ORDER — DOCUSATE SODIUM 100 MG PO CAPS: 100.0000 mg | ORAL_CAPSULE | Freq: Every day | ORAL | Status: DC | PRN

## 2022-05-13 MED ORDER — AMLODIPINE BESYLATE 5 MG PO TABS
5.0000 mg | ORAL_TABLET | Freq: Every day | ORAL | Status: DC
Start: 2022-05-13 — End: 2022-05-14
  Administered 2022-05-13 – 2022-05-14 (×2): 5 mg via ORAL
  Filled 2022-05-13 (×2): qty 1

## 2022-05-13 MED ORDER — ACETAMINOPHEN 325 MG PO TABS: 650.0000 mg | ORAL_TABLET | Freq: Four times a day (QID) | ORAL | Status: DC | PRN

## 2022-05-13 MED ORDER — PANTOPRAZOLE SODIUM 40 MG PO TBEC
40.0000 mg | DELAYED_RELEASE_TABLET | Freq: Every day | ORAL | Status: DC
  Administered 2022-05-13 – 2022-05-14 (×2): 40 mg via ORAL
  Filled 2022-05-13 (×2): qty 1

## 2022-05-13 MED ORDER — POTASSIUM CHLORIDE CRYS ER 20 MEQ PO TBCR
40.0000 meq | EXTENDED_RELEASE_TABLET | ORAL | Status: AC
  Administered 2022-05-13 (×2): 40 meq via ORAL
  Filled 2022-05-13 (×2): qty 2

## 2022-05-13 MED ORDER — ACETAMINOPHEN 650 MG RE SUPP: 650.0000 mg | Freq: Four times a day (QID) | RECTAL | Status: DC | PRN

## 2022-05-13 MED ORDER — OXYCODONE-ACETAMINOPHEN 5-325 MG PO TABS: 1.0000 | ORAL_TABLET | ORAL | Status: DC | PRN

## 2022-05-13 MED ORDER — IBUPROFEN 400 MG PO TABS
400.0000 mg | ORAL_TABLET | ORAL | Status: DC | PRN
Start: 2022-05-13 — End: 2022-05-14

## 2022-05-13 NOTE — Progress Notes (Signed)
[facility] PROGRESS NOTE   Obrien Huskins GYJ:[id number] DOB: 11-Sep-1981 DOA: 05/12/2022  PCP: Delma Officer, PA  Brief History/Interval Summary:  41 y.o. male with medical history significant of Obesity, OSA, HTN. Pt presented to ED with c/o perirectal abscess from PCP.  Underwent CT scan which raised concern for fistula and abscess.  Underwent I&D.  Started on Zosyn and was hospitalized.  Consultants: General surgery  Procedures: I&D in the emergency department of the perianal area    Subjective/Interval History: Patient mentions that he currently has pain level of 4-5 out of 10 in the anal area.  Denies any fever or chills.  No nausea vomiting.    Assessment/Plan:  Perianal abscess with concern for fistula Underwent I&D in the emergency department.  Started on Zosyn.  Follow-up on wound cultures.  Follow-up on blood cultures.  WBC elevated at 16.8.  Lactic acid level was normal.  HIV is nonreactive. General surgery to also evaluate.  Pain seems to be reasonably well controlled.  Essential hypertension He takes amlodipine and HCTZ and losartan prior to admission.  These are on hold currently.  Elevated blood pressure readings noted which could be from pain.  Continue to monitor for now. Okay to resume the amlodipine for now.  Hypokalemia Potassium noted to be 3.0 this morning.  Will be repleted.  Magnesium is 2.3.  Class III obesity Estimated body mass index is 53.75 kg/m as calculated from the following:   Height as of this encounter: 5\' 5"  (1.651 m).   Weight as of this encounter: 146.5 kg.   DVT Prophylaxis: Lovenox Code Status: Full code Family Communication: Discussed with the patient Disposition Plan: Hopefully return home in improved  Status is: Inpatient Remains inpatient appropriate because: Perianal abscess requiring IV antibiotic      Medications: Scheduled:  enoxaparin (LOVENOX) injection  40 mg Subcutaneous Q24H   potassium chloride   40 mEq Oral Q4H   Continuous:  sodium chloride Stopped (05/12/22 1852)   lactated ringers 100 mL/hr at 05/12/22 1850   piperacillin-tazobactam (ZOSYN)  IV 3.375 g (05/13/22 07/14/22)   2637 chloride, acetaminophen **OR** acetaminophen, ondansetron **OR** ondansetron (ZOFRAN) IV  Antibiotics: Anti-infectives (From admission, onward)    Start     Dose/Rate Route Frequency Ordered Stop   05/12/22 2300  piperacillin-tazobactam (ZOSYN) IVPB 3.375 g        3.375 g 12.5 mL/hr over 240 Minutes Intravenous Every 8 hours 05/12/22 2208     05/12/22 1745  piperacillin-tazobactam (ZOSYN) IVPB 3.375 g        3.375 g 100 mL/hr over 30 Minutes Intravenous  Once 05/12/22 1736 05/12/22 1849       Objective:  Vital Signs  Vitals:   05/12/22 2118 05/12/22 2315 05/13/22 0336 05/13/22 0826  BP: (!) 168/110 118/74 115/77 (!) 150/92  Pulse: 100 (!) 103 86 90  Resp: 20 19 17 16   Temp: 98.1 F (36.7 C) 99.6 F (37.6 C) 98.3 F (36.8 C) 98.3 F (36.8 C)  TempSrc: Oral Oral Oral Oral  SpO2: 96% 97% 97% 97%  Weight:      Height:        Intake/Output Summary (Last 24 hours) at 05/13/2022 0918 Last data filed at 05/13/2022 0857 Gross per 24 hour  Intake 794.87 ml  Output --  Net 794.87 ml   Filed Weights   05/12/22 1222  Weight: (!) 146.5 kg    General appearance: Awake alert.  In no distress Resp: Clear to auscultation bilaterally.  Normal  effort Cardio: S1-S2 is normal regular.  No S3-S4.  No rubs murmurs or bruit GI: Abdomen is soft.  Nontender nondistended.  Bowel sounds are present normal.  No masses organomegaly Extremities: No edema.  Full range of motion of lower extremities. Neurologic: Alert and oriented x3.  No focal neurological deficits.    Lab Results:  Data Reviewed: I have personally reviewed following labs and reports of the imaging studies  CBC: Recent Labs  Lab 05/12/22 1540 05/13/22 0204  WBC 16.3* 16.8*  NEUTROABS 11.7*  --   HGB 14.8 13.7  HCT 44.3 41.8   MCV 90.2 92.5  PLT 353 [id number]    Basic Metabolic Panel: Recent Labs  Lab 05/12/22 1540 05/13/22 0204  NA 139 138  K 3.1* 3.0*  CL 100 105  CO2 27 27  GLUCOSE 98 148*  BUN 9 8  CREATININE 0.74 0.91  CALCIUM 9.8 8.8*  MG  --  2.3    GFR: Estimated Creatinine Clearance: 141.4 mL/min (by C-G formula based on SCr of 0.91 mg/dL).  Liver Function Tests: Recent Labs  Lab 05/12/22 1540  AST 17  ALT 33  ALKPHOS 74  BILITOT 0.5  PROT 8.3*  ALBUMIN 4.5     Recent Results (from the past 240 hour(s))  MRSA Next Gen by PCR, Nasal     Status: None   Collection Time: 05/13/22  7:30 AM   Specimen: Nasal Mucosa; Nasal Swab  Result Value Ref Range Status   MRSA by PCR Next Gen NOT DETECTED NOT DETECTED Final    Comment: (NOTE) The GeneXpert MRSA Assay (FDA approved for NASAL specimens only), is one component of a comprehensive MRSA colonization surveillance program. It is not intended to diagnose MRSA infection nor to guide or monitor treatment for MRSA infections. Test performance is not FDA approved in patients less than 73 years old. Performed at [facility], [place] [address] [place], [place] [address]       Radiology Studies: CT PELVIS W CONTRAST  Result Date: 05/12/2022 CLINICAL DATA:  A 41 year old male presents with potential perianal abscess or fistula. EXAM: CT PELVIS WITH CONTRAST TECHNIQUE: Multidetector CT imaging of the pelvis was performed using the standard protocol following the bolus administration of intravenous contrast. RADIATION DOSE REDUCTION: This exam was performed according to the departmental dose-optimization program which includes automated exposure control, adjustment of the mA and/or kV according to patient size and/or use of iterative reconstruction technique. CONTRAST:  190mL OMNIPAQUE IOHEXOL 300 MG/ML  SOLN COMPARISON:  None available. FINDINGS: Urinary Tract: Urinary bladder with smooth contours and without signs of perivesical inflammation.  No distal ureteral dilation. Bowel: Gas and fluid containing collection measuring 4 x 1 cm appears to extend from the midportion to lower portion of the sphincter complex and is associated with abundant surrounding stranding. There is a 1 cm area on image 37/2 as well that may be contiguous with this area along the lower margin of the levator plate. Inflammation tracks along the lower margin of the pelvic floor. The dominant collection above could even be trapped between the gluteal folds though could be beneath the skin surface on the LEFT, difficult to determine based on CT imaging and coaptation of gluteal folds in the perianal region. Mild thickening of the distal rectum. No sign of bowel inflammation otherwise to the extent evaluated on this pelvic CT. Vascular/Lymphatic: No adenopathy in the pelvis, scattered small lymph nodes are presumably reactive. Vascular structures grossly patent on venous phase. Reproductive:  Unremarkable to the extent evaluated by CT. Other: Inflammation tracking throughout the LEFT gluteal fat with fat stranding extending into the LEFT inferior gluteal region and cephalad towards the upper margin of the gluteal cleft. Musculoskeletal: No acute bone finding or destructive bone process. IMPRESSION: 1. 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 2. Some of the gas in fluid could also be trapped within the gluteal cleft on the current study though suspect much of the loculated collection resides in the LEFT gluteal fold. Correlate with direct clinical inspection. Findings could be further evaluated as warranted with MRI utilizing fistula protocol. Ultimately colorectal surgical evaluation may be helpful. Electronically Signed   By:  Donzetta Kohut M.D.   On: 05/12/2022 16:58       LOS: 1 day   Osvaldo Shipper  [facility] Pager on [email or web address]  05/13/2022, 9:18 AM

## 2022-05-13 NOTE — Consult Note (Signed)
Consult Note  Johnathan Lee 1981-01-16  [id number].    Requesting MD: Osvaldo Shipper, MD Chief Complaint/Reason for Consult:  HPI:  Patient is a 41 year old male who presented to the ED from PCP with complaint of perirectal abscess. He has had some discomfort in rectum for about 2-3 weeks and thought he just had a hemorrhoid. Used OTC meds for this and felt like it was improving until the last few days when he noted worsened pain, inflammation and some spontaneous drainage from the area. He underwent I&D in the ED yesterday afternoon and reports he is feeling much better since then. He has had some fever, chills. Denies abdominal pain, nausea, chest pain, SOB, urinary symptoms. Having bowel movements and does not note significant pain with this. Patient reports hx of skin infections that usually come to a head on their own with warm compresses and then resolve. HIV unreactive, no personal hx of diabetes but does have a family hx. PMH otherwise significant for HTN, OSA, and obesity.   ROS: Negative other than HPI  History reviewed. No pertinent family history.  Past Medical History:  Diagnosis Date   Former smoker    Hypertension    Obesity    Osteoarthritis    Sleep apnea     History reviewed. No pertinent surgical history.  Social History:  reports that he has been smoking cigarettes and cigars. He has never used smokeless tobacco. He reports that he does not currently use alcohol. No history on file for drug use.  Allergies: No Known Allergies  Medications Prior to Admission  Medication Sig Dispense Refill   amLODipine (NORVASC) 5 MG tablet Take 5 mg by mouth daily.     buPROPion ER (WELLBUTRIN SR) 100 MG 12 hr tablet Take 100 mg by mouth 2 (two) times daily.     hydrochlorothiazide (HYDRODIURIL) 25 MG tablet TAKE 1 TABLET BY MOUTH ONCE A DAY IN THE MORNING (Patient taking differently: Take 25 mg by mouth daily.) 30 tablet 1   losartan (COZAAR) 50 MG tablet Take 50 mg  by mouth daily.     tiZANidine (ZANAFLEX) 4 MG tablet Take 1 tablet (4 mg total) by mouth every 8 (eight) hours as needed for muscle spasms. (Patient not taking: Reported on 05/12/2022) 30 tablet 0    Blood pressure (!) 150/92, pulse 90, temperature 98.3 F (36.8 C), temperature source Oral, resp. rate 16, height 5\' 5"  (1.651 m), weight (!) 146.5 kg, SpO2 97 %. Physical Exam:  General: pleasant, WD, obese male who is laying in bed in NAD HEENT: head is normocephalic, atraumatic.  Sclera are noninjected. Ears and nose without any masses or lesions.  Mouth is pink and moist Heart: regular, rate, and rhythm.  Palpable radial and pedal pulses bilaterally Lungs: Respiratory effort nonlabored Abd: soft, NT, ND GU: no induration surrounding I&D site, packing removed, purulent drainage present, minimal ttp MS: all 4 extremities are symmetrical with no cyanosis, clubbing, or edema. Skin: warm and dry with no masses, lesions, or rashes Neuro: Cranial nerves 2-12 grossly intact, sensation is normal throughout Psych: A&Ox3 with an appropriate affect.   Results for orders placed or performed during the hospital encounter of 05/12/22 (from the past 48 hour(s))  Comprehensive metabolic panel     Status: Abnormal   Collection Time: 05/12/22  3:40 PM  Result Value Ref Range   Sodium 139 135 - 145 mmol/L   Potassium 3.1 (L) 3.5 - 5.1 mmol/L   Chloride 100  98 - 111 mmol/L   CO2 27 22 - 32 mmol/L   Glucose, Bld 98 70 - 99 mg/dL    Comment: Glucose reference range applies only to samples taken after fasting for at least 8 hours.   BUN 9 6 - 20 mg/dL   Creatinine, Ser 4.91 0.61 - 1.24 mg/dL   Calcium 9.8 8.9 - 79.1 mg/dL   Total Protein 8.3 (H) 6.5 - 8.1 g/dL   Albumin 4.5 3.5 - 5.0 g/dL   AST 17 15 - 41 U/L   ALT 33 0 - 44 U/L   Alkaline Phosphatase 74 38 - 126 U/L   Total Bilirubin 0.5 0.3 - 1.2 mg/dL   GFR, Estimated >50 >56 mL/min    Comment: (NOTE) Calculated using the CKD-EPI Creatinine  Equation (2021)    Anion gap 12 5 - 15    Comment: Performed at [facility], [address] [place], [place] [address]  Lactic acid, plasma     Status: None   Collection Time: 05/12/22  3:40 PM  Result Value Ref Range   Lactic Acid, Venous 0.9 0.5 - 1.9 mmol/L    Comment: Performed at [facility], [address], [place], [place] [address]  CBC with Differential     Status: Abnormal   Collection Time: 05/12/22  3:40 PM  Result Value Ref Range   WBC 16.3 (H) 4.0 - 10.5 K/uL   RBC 4.91 4.22 - 5.81 MIL/uL   Hemoglobin 14.8 13.0 - 17.0 g/dL   HCT 37.4 82.7 - 07.8 %   MCV 90.2 80.0 - 100.0 fL   MCH 30.1 26.0 - 34.0 pg   MCHC 33.4 30.0 - 36.0 g/dL   RDW 67.5 44.9 - 20.1 %   Platelets 353 150 - 400 K/uL   nRBC 0.0 0.0 - 0.2 %   Neutrophils Relative % 73 %   Neutro Abs 11.7 (H) 1.7 - 7.7 K/uL   Lymphocytes Relative 13 %   Lymphs Abs 2.2 0.7 - 4.0 K/uL   Monocytes Relative 13 %   Monocytes Absolute 2.2 (H) 0.1 - 1.0 K/uL   Eosinophils Relative 0 %   Eosinophils Absolute 0.0 0.0 - 0.5 K/uL   Basophils Relative 0 %   Basophils Absolute 0.0 0.0 - 0.1 K/uL   Immature Granulocytes 1 %   Abs Immature Granulocytes 0.10 (H) 0.00 - 0.07 K/uL    Comment: Performed at [facility], [address], [place], [place] [address]  HIV Antibody (routine testing w rflx)     Status: None   Collection Time: 05/13/22  2:04 AM  Result Value Ref Range   HIV Screen 4th Generation wRfx Non Reactive Non Reactive    Comment: Performed at [facility], [address], [place], [place] [address]  CBC     Status: Abnormal   Collection Time: 05/13/22  2:04 AM  Result Value Ref Range   WBC 16.8 (H) 4.0 - 10.5 K/uL   RBC 4.52 4.22 - 5.81 MIL/uL   Hemoglobin 13.7 13.0 - 17.0 g/dL   HCT 83.2 54.9 - 82.6 %   MCV 92.5 80.0 - 100.0 fL   MCH 30.3 26.0 - 34.0 pg   MCHC 32.8 30.0 - 36.0 g/dL   RDW 41.5 83.0 - 94.0 %   Platelets 313 150 - 400 K/uL    nRBC 0.0 0.0 - 0.2 %    Comment: Performed at [facility], [address] [place], [place] [address]  Basic metabolic panel     Status: Abnormal   Collection Time: 05/13/22  2:04 AM  Result Value Ref Range   Sodium 138 135 - 145 mmol/L   Potassium 3.0 (L) 3.5 - 5.1 mmol/L   Chloride 105 98 - 111 mmol/L   CO2 27 22 - 32 mmol/L   Glucose, Bld 148 (H) 70 - 99 mg/dL    Comment: Glucose reference range applies only to samples taken after fasting for at least 8 hours.   BUN 8 6 - 20 mg/dL   Creatinine, Ser 0.24 0.61 - 1.24 mg/dL   Calcium 8.8 (L) 8.9 - 10.3 mg/dL   GFR, Estimated >09 >73 mL/min    Comment: (NOTE) Calculated using the CKD-EPI Creatinine Equation (2021)    Anion gap 6 5 - 15    Comment: Performed at [facility], [address], [place], [place] [address]  Magnesium     Status: None   Collection Time: 05/13/22  2:04 AM  Result Value Ref Range   Magnesium 2.3 1.7 - 2.4 mg/dL    Comment: Performed at [facility], [address], [place], [place] [address]  MRSA Next Gen by PCR, Nasal     Status: None   Collection Time: 05/13/22  7:30 AM   Specimen: Nasal Mucosa; Nasal Swab  Result Value Ref Range   MRSA by PCR Next Gen NOT DETECTED NOT DETECTED    Comment: (NOTE) The GeneXpert MRSA Assay (FDA approved for NASAL specimens only), is one component of a comprehensive MRSA colonization surveillance program. It is not intended to diagnose MRSA infection nor to guide or monitor treatment for MRSA infections. Test performance is not FDA approved in patients less than 57 years old. Performed at [facility], [address] [place], [place] [address]    CT PELVIS W CONTRAST  Result Date: 05/12/2022 CLINICAL DATA:  A 41 year old male presents with potential perianal abscess or fistula. EXAM: CT PELVIS WITH CONTRAST TECHNIQUE: Multidetector CT imaging of the pelvis was performed using the standard protocol following the bolus administration of  intravenous contrast. RADIATION DOSE REDUCTION: This exam was performed according to the departmental dose-optimization program which includes automated exposure control, adjustment of the mA and/or kV according to patient size and/or use of iterative reconstruction technique. CONTRAST:  OMNIPAQUE IOHEXOL 300 MG/ML  SOLN COMPARISON:  None available. FINDINGS: Urinary Tract: Urinary bladder with smooth contours and without signs of perivesical inflammation. No distal ureteral dilation. Bowel: Gas and fluid containing collection measuring 4 x 1 cm appears to extend from the midportion to lower portion of the sphincter complex and is associated with abundant surrounding stranding. There is a 1 cm area on image 37/2 as well that may be contiguous with this area along the lower margin of the levator plate. Inflammation tracks along the lower margin of the pelvic floor. The dominant collection above could even be trapped between the gluteal folds though could be beneath the skin surface on the LEFT, difficult to determine based on CT imaging and coaptation of gluteal folds in the perianal region. Mild thickening of the distal rectum. No sign of bowel inflammation otherwise to the extent evaluated on this pelvic CT. Vascular/Lymphatic: No adenopathy in the pelvis, scattered small lymph nodes are presumably reactive. Vascular structures grossly patent on venous phase. Reproductive:  Unremarkable to the extent evaluated by CT. Other: Inflammation tracking throughout the LEFT gluteal fat with fat stranding extending into the LEFT inferior gluteal region  and cephalad towards the upper margin of the gluteal cleft. Musculoskeletal: No acute bone finding or destructive bone process. IMPRESSION: 1. 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 2. Some of the gas in fluid could also be trapped within the gluteal cleft on the current study though suspect much of the loculated collection resides in the LEFT gluteal fold. Correlate with direct clinical inspection. Findings could be further evaluated as warranted with MRI utilizing fistula protocol. Ultimately colorectal surgical evaluation may be helpful. Electronically Signed   By: Donzetta Kohut M.D.   On: 05/12/2022 16:58      Assessment/Plan Perirectal abscess s/p I&D in the ED 05/12/22 - packing removed at bedside - start sitz baths today  - will arrange follow up in [facility] clinic for wound check - would not recommend repacking  - colace prn if hard stools - doesn't appear cultures were sent yesterday, would recommend PO augmentin to total 7 day course of abx on DC - HIV unreactive, A1c sent and mildly elevated at 6.1 recommend follow up with PCP - no indication for further surgical I&D at this time, likely stable for DC tomorrow if continuing to improve  FEN: HH diet, LR@75cc /h VTE: LMWH ID: [email or web address] 7/5>>   I reviewed ED provider notes, hospitalist notes, last 24 h vitals and pain scores, last 48 h intake and output, last 24 h labs and trends, and last 24 h imaging results.   Juliet Rude, [facility] 05/13/2022, 10:23 AM Please see Amion for pager number during day hours 7:00am-4:30pm

## 2022-05-13 NOTE — TOC Initial Note (Signed)
Transition of Care [facility]) - Initial/Assessment Note    Patient Details  Name: Johnathan Lee MRN: [id number] Date of Birth: [birth year]  Transition of Care [facility]) CM/SW Contact:    Lockie Pares, RN Phone Number: 05/13/2022, 5:23 PM  Clinical Narrative:                 41 year old presented with perirectal abbess on IV Abx. Patient uninsured btu is employed, but uninsured. EDD 2-3 days. . NO needs identified, may need assistance with medications.  SEND TO TOC PHARMACY  on FRIDAY> they can be kept in main pharmacy department for weekend DC.        Patient Goals and CMS Choice        Expected Discharge Plan and Services                                                Prior Living Arrangements/Services                       Activities of Daily Living      Permission Sought/Granted                  Emotional Assessment              Admission diagnosis:  Perianal abscess [K61.0] Patient Active Problem List   Diagnosis Date Noted   Perianal abscess 05/12/2022   HTN (hypertension) 05/12/2022   Hypokalemia 05/12/2022   PCP:  Delma Officer, PA Pharmacy:   [facility] [address] [place], [place] - [address] [place] [address] Phone: [phone] Fax: [phone]     Social Determinants of Health (SDOH) Interventions    Readmission Risk Interventions     No data to display

## 2022-05-13 NOTE — Plan of Care (Signed)

## 2022-05-14 ENCOUNTER — Other Ambulatory Visit (HOSPITAL_COMMUNITY): Payer: Self-pay

## 2022-05-14 LAB — CBC
HCT: 40.7 % (ref 39.0–52.0)
Hemoglobin: 13.7 g/dL (ref 13.0–17.0)
MCH: 30.8 pg (ref 26.0–34.0)
MCHC: 33.7 g/dL (ref 30.0–36.0)
MCV: 91.5 fL (ref 80.0–100.0)
Platelets: 315 10*3/uL (ref 150–400)
RBC: 4.45 MIL/uL (ref 4.22–5.81)
RDW: 13.5 % (ref 11.5–15.5)
WBC: 12.3 10*3/uL — ABNORMAL HIGH (ref 4.0–10.5)
nRBC: 0 % (ref 0.0–0.2)

## 2022-05-14 LAB — BASIC METABOLIC PANEL
Anion gap: 4 — ABNORMAL LOW (ref 5–15)
BUN: 8 mg/dL (ref 6–20)
CO2: 26 mmol/L (ref 22–32)
Calcium: 8.4 mg/dL — ABNORMAL LOW (ref 8.9–10.3)
Chloride: 109 mmol/L (ref 98–111)
Creatinine, Ser: 0.81 mg/dL (ref 0.61–1.24)
GFR, Estimated: >60 mL/min (ref >60)
Glucose, Bld: 133 mg/dL — ABNORMAL HIGH (ref 70–99)
Potassium: 3.7 mmol/L (ref 3.5–5.1)
Sodium: 139 mmol/L (ref 135–145)

## 2022-05-14 MED ORDER — IBUPROFEN 400 MG PO TABS: 400.0000 mg | ORAL_TABLET | ORAL | 0 refills | Status: DC | PRN

## 2022-05-14 MED ORDER — AMOXICILLIN-POT CLAVULANATE 875-125 MG PO TABS
1.0000 | ORAL_TABLET | Freq: Two times a day (BID) | ORAL | 0 refills | Status: AC
  Filled 2022-05-14: qty 10, 5d supply, fill #0

## 2022-05-14 MED ORDER — AMOXICILLIN-POT CLAVULANATE 875-125 MG PO TABS: 1.0000 | ORAL_TABLET | Freq: Two times a day (BID) | ORAL | 0 refills | Status: DC

## 2022-05-14 MED ORDER — OXYCODONE-ACETAMINOPHEN 5-325 MG PO TABS: 1.0000 | ORAL_TABLET | Freq: Four times a day (QID) | ORAL | 0 refills | Status: DC | PRN

## 2022-05-14 MED ORDER — DOCUSATE SODIUM 100 MG PO CAPS: 100.0000 mg | ORAL_CAPSULE | Freq: Two times a day (BID) | ORAL | 0 refills | Status: DC

## 2022-05-14 NOTE — TOC Progression Note (Signed)
Transition of Care [facility]) - Progression Note    Patient Details  Name: Johnathan Lee MRN: [id number] Date of Birth: 02-11-1981  Transition of Care [facility]) CM/SW Contact  Leone Haven, RN Phone Number: 05/14/2022, 10:41 AM  Clinical Narrative:    Patient is for dc today, he states he does have insurance with [employer], TOC to fill meds.          Expected Discharge Plan and Services           Expected Discharge Date: 05/14/22                                     Social Determinants of Health (SDOH) Interventions    Readmission Risk Interventions     No data to display

## 2022-05-14 NOTE — Plan of Care (Signed)

## 2022-05-14 NOTE — Progress Notes (Signed)
Explained discharge instructions to the patient. Reviewed and gave written instructions on rectal abscess care instructions and sitz bath instruction. Also reviewed follow up appointment and next medication administration times. Patient verbalized having an understanding of the instructions that were given. Removed patient's PIV and telemetry monitor. CCMD was notified. TOC meds and  personal belongings are in patient care. A cab voucher was given to patient to transport to retrieve his car from [facility] ER.

## 2022-05-14 NOTE — Discharge Summary (Signed)
[facility]  Physician Discharge Summary   Patient ID: Johnathan Lee MRN: [id number] DOB/AGE: May 20, 1981 41 y.o.  Admit date: 05/12/2022 Discharge date: 05/14/2022    PCP: Delma Officer, PA  DISCHARGE DIAGNOSES:  Principal Problem:   Perianal abscess Active Problems:   HTN (hypertension)   Hypokalemia   RECOMMENDATIONS FOR OUTPATIENT FOLLOW UP: General surgery to arrange outpatient follow-up Patient instructed to follow-up with PCP to discuss elevated HbA1c   Home Health: None Equipment/Devices: None  CODE STATUS: Full code  DISCHARGE CONDITION: fair  Diet recommendation: As before  INITIAL HISTORY: 41 y.o. male with medical history significant of Obesity, OSA, HTN. Pt presented to ED with c/o perirectal abscess from PCP.  Underwent CT scan which raised concern for fistula and abscess.  Underwent I&D.  Started on Zosyn and was hospitalized.   Consultants: General surgery   Procedures: I&D in the emergency department of the perianal area    HOSPITAL COURSE:   Perianal abscess with concern for fistula Underwent I&D in the emergency department.  Started on Zosyn.  Unfortunately looks like cultures were never sent from the emergency department.  WBC is improving.  Pain is better.  Seen by general surgery.  Cleared for discharge on Augmentin.  Pain is reasonably well controlled.  Essential hypertension Resume home medications.  Elevated HbA1c/prediabetes Noted to have A1c level of 6.1.  Patient to discuss this with his PCP.  Hypokalemia Repleted   Class III obesity Estimated body mass index is 53.75 kg/m as calculated from the following:   Height as of this encounter: 5\' 5"  (1.651 m).   Weight as of this encounter: 146.5 kg.   Patient is stable.  Feels better.  Okay for discharge home today.   PERTINENT LABS:  The results of significant diagnostics from this hospitalization (including imaging, microbiology, ancillary and laboratory) are listed  below for reference.    Microbiology: Recent Results (from the past 240 hour(s))  Culture, blood (routine x 2)     Status: None (Preliminary result)   Collection Time: 05/12/22  3:40 PM   Specimen: BLOOD  Result Value Ref Range Status   Specimen Description   Final    BLOOD LEFT ANTECUBITAL Performed at [facility], [address], [place], [place]    Special Requests   Final    BOTTLES DRAWN AEROBIC AND ANAEROBIC Blood Culture results may not be optimal due to an excessive volume of blood received in culture bottles Performed at [facility], [address] [place], [place]    Culture   Final    NO GROWTH 3 DAYS Performed at [facility], [address] [place], [place]    Report Status PENDING  Incomplete  Culture, blood (routine x 2)     Status: None (Preliminary result)   Collection Time: 05/12/22  4:15 PM   Specimen: BLOOD  Result Value Ref Range Status   Specimen Description   Final    BLOOD RIGHT ANTECUBITAL Performed at [facility], [address] [place], [place]    Special Requests   Final    Blood Culture adequate volume BOTTLES DRAWN AEROBIC AND ANAEROBIC Performed at [facility], [address], [place], [place]    Culture   Final    NO GROWTH 3 DAYS Performed at [facility], [address] [place], [place]    Report Status PENDING  Incomplete  MRSA Next Gen by  PCR, Nasal     Status: None   Collection Time: 05/13/22  7:30 AM   Specimen: Nasal Mucosa; Nasal Swab  Result Value Ref Range Status   MRSA by PCR Next Gen NOT DETECTED NOT DETECTED Final    Comment: (NOTE) The GeneXpert MRSA Assay (FDA approved for NASAL specimens only), is one component of a comprehensive MRSA colonization surveillance program. It is not intended to diagnose MRSA infection nor to guide or monitor treatment for MRSA infections. Test  performance is not FDA approved in patients less than 62 years old. Performed at [facility], [address], [place], [place] [address]      Labs:   Basic Metabolic Panel: Recent Labs  Lab 05/12/22 1540 05/13/22 0204 05/14/22 0031  NA 139 138 139  K 3.1* 3.0* 3.7  CL 100 105 109  CO2 27 27 26   GLUCOSE 98 148* 133*  BUN 9 8 8   CREATININE 0.74 0.91 0.81  CALCIUM 9.8 8.8* 8.4*  MG  --  2.3  --    Liver Function Tests: Recent Labs  Lab 05/12/22 1540  AST 17  ALT 33  ALKPHOS 74  BILITOT 0.5  PROT 8.3*  ALBUMIN 4.5    CBC: Recent Labs  Lab 05/12/22 1540 05/13/22 0204 05/14/22 0031  WBC 16.3* 16.8* 12.3*  NEUTROABS 11.7*  --   --   HGB 14.8 13.7 13.7  HCT 44.3 41.8 40.7  MCV 90.2 92.5 91.5  PLT 353 313 315     IMAGING STUDIES CT PELVIS W CONTRAST  Result Date: 05/12/2022 CLINICAL DATA:  A 41 year old male presents with potential perianal abscess or fistula. EXAM: CT PELVIS WITH CONTRAST TECHNIQUE: Multidetector CT imaging of the pelvis was performed using the standard protocol following the bolus administration of intravenous contrast. RADIATION DOSE REDUCTION: This exam was performed according to the departmental dose-optimization program which includes automated exposure control, adjustment of the mA and/or kV according to patient size and/or use of iterative reconstruction technique. CONTRAST:  07/13/2022 OMNIPAQUE IOHEXOL 300 MG/ML  SOLN COMPARISON:  None available. FINDINGS: Urinary Tract: Urinary bladder with smooth contours and without signs of perivesical inflammation. No distal ureteral dilation. Bowel: Gas and fluid containing collection measuring 4 x 1 cm appears to extend from the midportion to lower portion of the sphincter complex and is associated with abundant surrounding stranding. There is a 1 cm area on image 37/2 as well that may be contiguous with this area along the lower margin of the levator plate. Inflammation tracks along the lower margin of  the pelvic floor. The dominant collection above could even be trapped between the gluteal folds though could be beneath the skin surface on the LEFT, difficult to determine based on CT imaging and coaptation of gluteal folds in the perianal region. Mild thickening of the distal rectum. No sign of bowel inflammation otherwise to the extent evaluated on this pelvic CT. Vascular/Lymphatic: No adenopathy in the pelvis, scattered small lymph nodes are presumably reactive. Vascular structures grossly patent on venous phase. Reproductive:  Unremarkable to the extent evaluated by CT. Other: Inflammation tracking throughout the LEFT gluteal fat with fat stranding extending into the LEFT inferior gluteal region and cephalad towards the upper margin of the gluteal cleft. Musculoskeletal: No acute bone finding or destructive bone process. IMPRESSION: 1. Findings suspicious for perianal fistula arising from the mid to lower anal canal/sphincter complex and extending into the gluteal cleft, potentially with loculated fluid in the LEFT gluteal fold and associated with  extensive subcutaneous fat stranding extending throughout the LEFT gluteal region. 2. Some of the gas in fluid could also be trapped within the gluteal cleft on the current study though suspect much of the loculated collection resides in the LEFT gluteal fold. Correlate with direct clinical inspection. Findings could be further evaluated as warranted with MRI utilizing fistula protocol. Ultimately colorectal surgical evaluation may be helpful. Electronically Signed   By: Donzetta Kohut M.D.   On: 05/12/2022 16:58    DISCHARGE EXAMINATION: Vitals:   05/13/22 2258 05/13/22 2359 05/14/22 0317 05/14/22 0900  BP:  (!) 149/94 134/76 (!) 146/90  Pulse: 93 82 72 92  Resp: 18 17 17 18   Temp:  98.6 F (37 C) 97.6 F (36.4 C) 98.5 F (36.9 C)  TempSrc:  Oral Axillary Oral  SpO2: 96% 98% 98% 98%  Weight:      Height:       General appearance: Awake alert.  In no  distress Resp: Clear to auscultation bilaterally.  Normal effort Cardio: S1-S2 is normal regular.  No S3-S4.  No rubs murmurs or bruit GI: Abdomen is soft.  Nontender nondistended.  Bowel sounds are present normal.  No masses organomegaly Extremities: No edema.  Full range of motion of lower extremities. Neurologic: Alert and oriented x3.  No focal neurological deficits.    DISPOSITION: Home  Discharge Instructions     Call MD for:  difficulty breathing, headache or visual disturbances   Complete by: As directed    Call MD for:  extreme fatigue   Complete by: As directed    Call MD for:  persistant dizziness or light-headedness   Complete by: As directed    Call MD for:  persistant nausea and vomiting   Complete by: As directed    Call MD for:  severe uncontrolled pain   Complete by: As directed    Call MD for:  temperature >100.4   Complete by: As directed    Diet - low sodium heart healthy   Complete by: As directed    Discharge instructions   Complete by: As directed    Please keep your appointment with general surgery as instructed.  Take your medications as prescribed.  Seek attention if you develop fever chills nausea vomiting and worsening abdominal pain.  You were cared for by a hospitalist during your hospital stay. If you have any questions about your discharge medications or the care you received while you were in the hospital after you are discharged, you can call the unit and asked to speak with the hospitalist on call if the hospitalist that took care of you is not available. Once you are discharged, your primary care physician will handle any further medical issues. Please note that NO REFILLS for any discharge medications will be authorized once you are discharged, as it is imperative that you return to your primary care physician (or establish a relationship with a primary care physician if you do not have one) for your aftercare needs so that they can reassess your  need for medications and monitor your lab values. If you do not have a primary care physician, you can call [phone] for a physician referral.   Increase activity slowly   Complete by: As directed           Allergies as of 05/14/2022   No Known Allergies      Medication List     STOP taking these medications    tiZANidine 4 MG tablet Commonly known  as: ZANAFLEX       TAKE these medications    amLODipine 5 MG tablet Commonly known as: NORVASC Take 5 mg by mouth daily.   amoxicillin-clavulanate 875-125 MG tablet Commonly known as: AUGMENTIN Take 1 tablet by mouth 2 (two) times daily for 5 days.   buPROPion ER 100 MG 12 hr tablet Commonly known as: WELLBUTRIN SR Take 100 mg by mouth 2 (two) times daily.   docusate sodium 100 MG capsule Commonly known as: COLACE Take 1 capsule (100 mg total) by mouth 2 (two) times daily.   hydrochlorothiazide 25 MG tablet Commonly known as: HYDRODIURIL TAKE 1 TABLET BY MOUTH ONCE A DAY IN THE MORNING What changed:  how much to take how to take this when to take this   ibuprofen 400 MG tablet Commonly known as: ADVIL Take 1 tablet (400 mg total) by mouth every 4 (four) hours as needed for mild pain.   losartan 50 MG tablet Commonly known as: COZAAR Take 50 mg by mouth daily.   oxyCODONE-acetaminophen 5-325 MG tablet Commonly known as: PERCOCET/ROXICET Take 1 tablet by mouth every 6 (six) hours as needed for severe pain.          Follow-up Information     Surgery, [facility]. Go on 05/27/2022.   Specialty: General Surgery Why: 3:15 PM with Saunders Glance, PA-C for wound check. Please arrive 30 min prior to appointment time and have ID and insurance information with you. Contact information: [address] [place] [address] [phone]                 TOTAL DISCHARGE TIME: 35 minutes  Taylan Marez [employer] on [email or web address]  05/15/2022, 1:12 PM

## 2022-05-14 NOTE — Progress Notes (Signed)
Progress Note     Subjective: Perirectal pain is improving. Has been doing sitz after BMs.   Objective: Vital signs in last 24 hours: Temp:  [97.6 F (36.4 C)-99 F (37.2 C)] 97.6 F (36.4 C) (07/07 0317) Pulse Rate:  [72-93] 72 (07/07 0317) Resp:  [17-18] 17 (07/07 0317) BP: (134-155)/(76-99) 134/76 (07/07 0317) SpO2:  [96 %-99 %] 98 % (07/07 0317) Last BM Date : 05/13/22  Intake/Output from previous day: 07/06 0701 - 07/07 0700 In: 1815.9 [P.O.:600; I.V.:1015.9; IV Piggyback:200] Out: 150 [Urine:150] Intake/Output this shift: No intake/output data recorded.  PE: General: pleasant, WD, obese male who is laying in bed in NAD Heart: regular, rate, and rhythm.  Lungs: Respiratory effort nonlabored GU: no induration surrounding I&D site, purulent drainage present, minimal ttp MS: all 4 extremities are symmetrical with no cyanosis, clubbing, or edema. Skin: warm and dry with no masses, lesions, or rashes Psych: A&Ox3 with an appropriate affect.   Lab Results:  Recent Labs    05/13/22 0204 05/14/22 0031  WBC 16.8* 12.3*  HGB 13.7 13.7  HCT 41.8 40.7  PLT 313 315   BMET Recent Labs    05/13/22 0204 05/14/22 0031  NA 138 139  K 3.0* 3.7  CL 105 109  CO2 27 26  GLUCOSE 148* 133*  BUN 8 8  CREATININE 0.91 0.81  CALCIUM 8.8* 8.4*   PT/INR No results for input(s): "LABPROT", "INR" in the last 72 hours. CMP     Component Value Date/Time   NA 139 05/14/2022 0031   K 3.7 05/14/2022 0031   CL 109 05/14/2022 0031   CO2 26 05/14/2022 0031   GLUCOSE 133 (H) 05/14/2022 0031   BUN 8 05/14/2022 0031   CREATININE 0.81 05/14/2022 0031   CALCIUM 8.4 (L) 05/14/2022 0031   PROT 8.3 (H) 05/12/2022 1540   ALBUMIN 4.5 05/12/2022 1540   AST 17 05/12/2022 1540   ALT 33 05/12/2022 1540   ALKPHOS 74 05/12/2022 1540   BILITOT 0.5 05/12/2022 1540   GFRNONAA >60 05/14/2022 0031   Lipase  No results found for: "LIPASE"     Studies/Results: CT PELVIS W  CONTRAST  Result Date: 05/12/2022 CLINICAL DATA:  A 41 year old male presents with potential perianal abscess or fistula. EXAM: CT PELVIS WITH CONTRAST TECHNIQUE: Multidetector CT imaging of the pelvis was performed using the standard protocol following the bolus administration of intravenous contrast. RADIATION DOSE REDUCTION: This exam was performed according to the departmental dose-optimization program which includes automated exposure control, adjustment of the mA and/or kV according to patient size and/or use of iterative reconstruction technique. CONTRAST:  OMNIPAQUE IOHEXOL 300 MG/ML  SOLN COMPARISON:  None available. FINDINGS: Urinary Tract: Urinary bladder with smooth contours and without signs of perivesical inflammation. No distal ureteral dilation. Bowel: Gas and fluid containing collection measuring 4 x 1 cm appears to extend from the midportion to lower portion of the sphincter complex and is associated with abundant surrounding stranding. There is a 1 cm area on image 37/2 as well that may be contiguous with this area along the lower margin of the levator plate. Inflammation tracks along the lower margin of the pelvic floor. The dominant collection above could even be trapped between the gluteal folds though could be beneath the skin surface on the LEFT, difficult to determine based on CT imaging and coaptation of gluteal folds in the perianal region. Mild thickening of the distal rectum. No sign of bowel inflammation otherwise to the extent evaluated on  this pelvic CT. Vascular/Lymphatic: No adenopathy in the pelvis, scattered small lymph nodes are presumably reactive. Vascular structures grossly patent on venous phase. Reproductive:  Unremarkable to the extent evaluated by CT. Other: Inflammation tracking throughout the LEFT gluteal fat with fat stranding extending into the LEFT inferior gluteal region and cephalad towards the upper margin of the gluteal cleft. Musculoskeletal: No acute bone  finding or destructive bone process. IMPRESSION: 1. 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 2. Some of the gas in fluid could also be trapped within the gluteal cleft on the current study though suspect much of the loculated collection resides in the LEFT gluteal fold. Correlate with direct clinical inspection. Findings could be further evaluated as warranted with MRI utilizing fistula protocol. Ultimately colorectal surgical evaluation may be helpful. Electronically Signed   By: Donzetta Kohut M.D.   On: 05/12/2022 16:58    Anti-infectives: Anti-infectives (From admission, onward)    Start     Dose/Rate Route Frequency Ordered Stop   05/12/22 2300  piperacillin-tazobactam (ZOSYN) IVPB 3.375 g        3.375 g 12.5 mL/hr over 240 Minutes Intravenous Every 8 hours 05/12/22 2208     05/12/22 1745  piperacillin-tazobactam (ZOSYN) IVPB 3.375 g        3.375 g 100 mL/hr over 30 Minutes Intravenous  Once 05/12/22 1736 05/12/22 1849        Assessment/Plan  Perirectal abscess s/p I&D in the ED 05/12/22 - continue sitz baths today  - follow up arranged in [facility] clinic for wound check - would not recommend repacking  - colace prn if hard stools - doesn't appear cultures were sent yesterday, would recommend PO augmentin to total 7 day course of abx on DC - HIV unreactive, A1c sent and mildly elevated at 6.1 recommend follow up with PCP - no indication for further surgical I&D at this time, stable for DC from a surgical standpoint    FEN: HH diet, LR@75cc /h VTE: LMWH ID: Zosyn 7/5>>   LOS: 2 days   I reviewed hospitalist notes, last 24 h vitals and pain scores, last 48 h intake and output, and last 24 h labs and trends.     Juliet Rude,  [facility] Surgery 05/14/2022, 9:02 AM Please see Amion for pager number during day hours 7:00am-4:30pm

## 2022-05-17 LAB — CULTURE, BLOOD (ROUTINE X 2)
Culture: NO GROWTH
Culture: NO GROWTH
Special Requests: ADEQUATE

## 2022-06-15 ENCOUNTER — Ambulatory Visit: Payer: Self-pay | Admitting: General Surgery

## 2022-06-18 NOTE — Patient Instructions (Signed)
DUE TO COVID-19 ONLY TWO VISITORS  (aged 43 and older)  ARE ALLOWED TO COME WITH YOU AND STAY IN THE WAITING ROOM ONLY DURING PRE OP AND PROCEDURE.   **NO VISITORS ARE ALLOWED IN THE SHORT STAY AREA OR RECOVERY ROOM!!**  IF YOU WILL BE ADMITTED INTO THE HOSPITAL YOU ARE ALLOWED ONLY FOUR SUPPORT PEOPLE DURING VISITATION HOURS ONLY (7 AM -8PM)   The support person(s) must pass our screening, gel in and out, and wear a mask at all times, including in the patient's room. Patients must also wear a mask when staff or their support person are in the room. Visitors GUEST BADGE MUST BE WORN VISIBLY  One adult visitor may remain with you overnight and MUST be in the room by 8 P.M.     Your procedure is scheduled on: 07/01/22   Report to Operating Room Services Main Entrance    Report to admitting at : 8:15 AM   Call this number if you have problems the morning of surgery [phone]   Do not eat food :After Midnight.   After Midnight you may have the following liquids until : 7:30 AM DAY OF SURGERY  Water Black Coffee (sugar ok, NO MILK/CREAM OR CREAMERS)  Tea (sugar ok, NO MILK/CREAM OR CREAMERS) regular and decaf                             Plain Jell-O (NO RED)                                           Fruit ices (not with fruit pulp, NO RED)                                     Popsicles (NO RED)                                                                  Juice: apple, WHITE grape, WHITE cranberry Sports drinks like Gatorade (NO RED)             FOLLOW BOWEL PREP AND ANY ADDITIONAL PRE OP INSTRUCTIONS YOU RECEIVED FROM YOUR SURGEON'S OFFICE!!!    Oral Hygiene is also important to reduce your risk of infection.                                    Remember - BRUSH YOUR TEETH THE MORNING OF SURGERY WITH YOUR REGULAR TOOTHPASTE   Do NOT smoke after Midnight   Take these medicines the morning of surgery with A SIP OF WATER: bupropion,amlodipine.  DO NOT TAKE ANY ORAL DIABETIC  MEDICATIONS DAY OF YOUR SURGERY  Bring CPAP mask and tubing day of surgery.                              You may not have any metal on your body including hair pins, jewelry, and body piercing  Do not wear lotions, powders, perfumes/cologne, or deodorant.               Men may shave face and neck.   Do not bring valuables to the hospital. [facility] IS NOT             RESPONSIBLE   FOR VALUABLES.   Contacts, dentures or bridgework may not be worn into surgery.   Bring small overnight bag day of surgery.   DO NOT BRING YOUR HOME MEDICATIONS TO THE HOSPITAL. PHARMACY WILL DISPENSE MEDICATIONS LISTED ON YOUR MEDICATION LIST TO YOU DURING YOUR ADMISSION IN THE HOSPITAL!    Patients discharged on the day of surgery will not be allowed to drive home.  Someone NEEDS to stay with you for the first 24 hours after anesthesia.   Special Instructions: Bring a copy of your healthcare power of attorney and living will documents         the day of surgery if you haven't scanned them before.              Please read over the following fact sheets you were given: IF YOU HAVE QUESTIONS ABOUT YOUR PRE-OP INSTRUCTIONS PLEASE CALL [phone]     [facility] - Preparing for Surgery Before surgery, you can play an important role.  Because skin is not sterile, your skin needs to be as free of germs as possible.  You can reduce the number of germs on your skin by washing with CHG (chlorahexidine gluconate) soap before surgery.  CHG is an antiseptic cleaner which kills germs and bonds with the skin to continue killing germs even after washing. Please DO NOT use if you have an allergy to CHG or antibacterial soaps.  If your skin becomes reddened/irritated stop using the CHG and inform your nurse when you arrive at Short Stay. Do not shave (including legs and underarms) for at least 48 hours prior to the first CHG shower.  You may shave your face/neck. Please follow these instructions carefully:  1.   Shower with CHG Soap the night before surgery and the  morning of Surgery.  2.  If you choose to wash your hair, wash your hair first as usual with your  normal  shampoo.  3.  After you shampoo, rinse your hair and body thoroughly to remove the  shampoo.                           4.  Use CHG as you would any other liquid soap.  You can apply chg directly  to the skin and wash                       Gently with a scrungie or clean washcloth.  5.  Apply the CHG Soap to your body ONLY FROM THE NECK DOWN.   Do not use on face/ open                           Wound or open sores. Avoid contact with eyes, ears mouth and genitals (private parts).                       Wash face,  Genitals (private parts) with your normal soap.             6.  Wash thoroughly, paying special attention to the area where your  surgery  will be performed.  7.  Thoroughly rinse your body with warm water from the neck down.  8.  DO NOT shower/wash with your normal soap after using and rinsing off  the CHG Soap.                9.  Pat yourself dry with a clean towel.            10.  Wear clean pajamas.            11.  Place clean sheets on your bed the night of your first shower and do not  sleep with pets. Day of Surgery : Do not apply any lotions/deodorants the morning of surgery.  Please wear clean clothes to the hospital/surgery center.  FAILURE TO FOLLOW THESE INSTRUCTIONS MAY RESULT IN THE CANCELLATION OF YOUR SURGERY PATIENT SIGNATURE_________________________________  NURSE SIGNATURE__________________________________  ________________________________________________________________________

## 2022-06-21 ENCOUNTER — Encounter (HOSPITAL_COMMUNITY)
Admission: RE | Admit: 2022-06-21 | Discharge: 2022-06-21 | Disposition: A | Discharge status: Home / Self Care | Payer: No Typology Code available for payment source | Source: Ambulatory Visit | Attending: General Surgery | Admitting: General Surgery

## 2022-06-21 ENCOUNTER — Encounter (HOSPITAL_COMMUNITY): Payer: Self-pay

## 2022-06-21 ENCOUNTER — Other Ambulatory Visit: Payer: Self-pay

## 2022-06-21 VITALS — BP 142/87 | HR 96 | Temp 98.3°F | Ht 65.0 in | Wt 319.0 lb

## 2022-06-21 DIAGNOSIS — Z01818 Encounter for other preprocedural examination: Secondary | ICD-10-CM | POA: Insufficient documentation

## 2022-06-21 DIAGNOSIS — I10 Essential (primary) hypertension: Secondary | ICD-10-CM | POA: Diagnosis not present

## 2022-06-21 DIAGNOSIS — R7303 Prediabetes: Secondary | ICD-10-CM | POA: Insufficient documentation

## 2022-06-21 HISTORY — DX: Unspecified asthma, uncomplicated: J45.909

## 2022-06-21 LAB — CBC
HCT: 43.4 % (ref 39.0–52.0)
Hemoglobin: 14.5 g/dL (ref 13.0–17.0)
MCH: 30.9 pg (ref 26.0–34.0)
MCHC: 33.4 g/dL (ref 30.0–36.0)
MCV: 92.5 fL (ref 80.0–100.0)
Platelets: 374 10*3/uL (ref 150–400)
RBC: 4.69 MIL/uL (ref 4.22–5.81)
RDW: 14.2 % (ref 11.5–15.5)
WBC: 8.2 10*3/uL (ref 4.0–10.5)
nRBC: 0 % (ref 0.0–0.2)

## 2022-06-21 LAB — BASIC METABOLIC PANEL
Anion gap: 8 (ref 5–15)
BUN: 12 mg/dL (ref 6–20)
CO2: 25 mmol/L (ref 22–32)
Calcium: 9.3 mg/dL (ref 8.9–10.3)
Chloride: 104 mmol/L (ref 98–111)
Creatinine, Ser: 1.07 mg/dL (ref 0.61–1.24)
GFR, Estimated: >60 mL/min (ref >60)
Glucose, Bld: 103 mg/dL — ABNORMAL HIGH (ref 70–99)
Potassium: 2.6 mmol/L — CL (ref 3.5–5.1)
Sodium: 137 mmol/L (ref 135–145)

## 2022-06-21 NOTE — Progress Notes (Signed)
For Short Stay: COVID SWAB appointment date: Date of COVID positive in last 90 days:  Bowel Prep reminder:   For Anesthesia: PCP - Daralene Milch: PA Cardiologist - N/A  Chest x-ray -  EKG -  Stress Test -  ECHO -  Cardiac Cath -  Pacemaker/ICD device last checked: Pacemaker orders received: Device Rep notified:  Spinal Cord Stimulator:  Sleep Study - Yes CPAP - Yes  Fasting Blood Sugar - N/A Checks Blood Sugar _____ times a day Date and result of last Hgb A1c-6.1: 05/13/22  Blood Thinner Instructions: Aspirin Instructions: Last Dose:  Activity level: Can go up a flight of stairs and activities of daily living without stopping and without chest pain and/or shortness of breath   Able to exercise without chest pain and/or shortness of breath   Unable to go up a flight of stairs without chest pain and/or shortness of breath     Anesthesia review: Hx: HTN,OSA(CPAP)  Patient denies shortness of breath, fever, cough and chest pain at PAT appointment   Patient verbalized understanding of instructions that were given to them at the PAT appointment. Patient was also instructed that they will need to review over the PAT instructions again at home before surgery.

## 2022-06-23 NOTE — Progress Notes (Signed)
Lab. Results: Potassium: 2.6

## 2022-07-01 ENCOUNTER — Ambulatory Visit (HOSPITAL_BASED_OUTPATIENT_CLINIC_OR_DEPARTMENT_OTHER): Payer: PRIVATE HEALTH INSURANCE | Admitting: Certified Registered"

## 2022-07-01 ENCOUNTER — Encounter (HOSPITAL_COMMUNITY)
Admission: RE | Disposition: A | Discharge status: Home / Self Care | Payer: Self-pay | Source: Ambulatory Visit | Attending: General Surgery

## 2022-07-01 ENCOUNTER — Ambulatory Visit (HOSPITAL_COMMUNITY)
Admission: RE | Admit: 2022-07-01 | Discharge: 2022-07-01 | Disposition: A | Discharge status: Home / Self Care | Payer: PRIVATE HEALTH INSURANCE | Source: Ambulatory Visit | Attending: General Surgery | Admitting: General Surgery

## 2022-07-01 ENCOUNTER — Encounter (HOSPITAL_COMMUNITY): Payer: Self-pay | Admitting: General Surgery

## 2022-07-01 ENCOUNTER — Ambulatory Visit (HOSPITAL_COMMUNITY): Payer: PRIVATE HEALTH INSURANCE | Admitting: Certified Registered"

## 2022-07-01 DIAGNOSIS — Z87891 Personal history of nicotine dependence: Secondary | ICD-10-CM | POA: Diagnosis not present

## 2022-07-01 DIAGNOSIS — M1990 Unspecified osteoarthritis, unspecified site: Secondary | ICD-10-CM | POA: Insufficient documentation

## 2022-07-01 DIAGNOSIS — G4730 Sleep apnea, unspecified: Secondary | ICD-10-CM

## 2022-07-01 DIAGNOSIS — K603 Anal fistula: Secondary | ICD-10-CM | POA: Insufficient documentation

## 2022-07-01 DIAGNOSIS — Z6841 Body Mass Index (BMI) 40.0 and over, adult: Secondary | ICD-10-CM | POA: Diagnosis not present

## 2022-07-01 DIAGNOSIS — J45909 Unspecified asthma, uncomplicated: Secondary | ICD-10-CM | POA: Insufficient documentation

## 2022-07-01 DIAGNOSIS — I10 Essential (primary) hypertension: Secondary | ICD-10-CM | POA: Insufficient documentation

## 2022-07-01 DIAGNOSIS — Z79899 Other long term (current) drug therapy: Secondary | ICD-10-CM | POA: Insufficient documentation

## 2022-07-01 HISTORY — PX: RECTAL EXAM UNDER ANESTHESIA: SHX6399

## 2022-07-01 HISTORY — PX: FISTULOTOMY: SHX6413

## 2022-07-01 LAB — BASIC METABOLIC PANEL
Anion gap: 7 (ref 5–15)
BUN: 14 mg/dL (ref 6–20)
CO2: 27 mmol/L (ref 22–32)
Calcium: 9.4 mg/dL (ref 8.9–10.3)
Chloride: 107 mmol/L (ref 98–111)
Creatinine, Ser: 0.91 mg/dL (ref 0.61–1.24)
GFR, Estimated: >60 mL/min (ref >60)
Glucose, Bld: 119 mg/dL — ABNORMAL HIGH (ref 70–99)
Potassium: 3.3 mmol/L — ABNORMAL LOW (ref 3.5–5.1)
Sodium: 141 mmol/L (ref 135–145)

## 2022-07-01 LAB — GLUCOSE, CAPILLARY: Glucose-Capillary: 125 mg/dL — ABNORMAL HIGH (ref 70–99)

## 2022-07-01 SURGERY — EXAM UNDER ANESTHESIA, RECTUM
Anesthesia: General | Site: Rectum

## 2022-07-01 MED ORDER — ACETAMINOPHEN 500 MG PO TABS: 1000.0000 mg | ORAL_TABLET | Freq: Once | ORAL | Status: DC | PRN

## 2022-07-01 MED ORDER — ROCURONIUM BROMIDE 10 MG/ML (PF) SYRINGE
PREFILLED_SYRINGE | INTRAVENOUS | Status: AC
  Filled 2022-07-01: qty 10

## 2022-07-01 MED ORDER — ONDANSETRON HCL 4 MG/2ML IJ SOLN
INTRAMUSCULAR | Status: DC | PRN
  Administered 2022-07-01: 4 mg via INTRAVENOUS

## 2022-07-01 MED ORDER — ONDANSETRON HCL 4 MG/2ML IJ SOLN
INTRAMUSCULAR | Status: AC
  Filled 2022-07-01: qty 2

## 2022-07-01 MED ORDER — ACETAMINOPHEN 160 MG/5ML PO SOLN: 1000.0000 mg | Freq: Once | ORAL | Status: DC | PRN

## 2022-07-01 MED ORDER — PROPOFOL 10 MG/ML IV BOLUS
INTRAVENOUS | Status: DC | PRN
  Administered 2022-07-01: 170 mg via INTRAVENOUS

## 2022-07-01 MED ORDER — ACETAMINOPHEN 10 MG/ML IV SOLN: 1000.0000 mg | Freq: Once | INTRAVENOUS | Status: DC | PRN

## 2022-07-01 MED ORDER — LIDOCAINE 2% (20 MG/ML) 5 ML SYRINGE
INTRAMUSCULAR | Status: DC | PRN
  Administered 2022-07-01: 100 mg via INTRAVENOUS

## 2022-07-01 MED ORDER — ACETAMINOPHEN 500 MG PO TABS
1000.0000 mg | ORAL_TABLET | ORAL | Status: AC
  Administered 2022-07-01: 1000 mg via ORAL
  Filled 2022-07-01: qty 2

## 2022-07-01 MED ORDER — SUGAMMADEX SODIUM 500 MG/5ML IV SOLN
INTRAVENOUS | Status: DC | PRN
  Administered 2022-07-01 (×2): 150 mg via INTRAVENOUS

## 2022-07-01 MED ORDER — CELECOXIB 200 MG PO CAPS
200.0000 mg | ORAL_CAPSULE | ORAL | Status: AC
  Administered 2022-07-01: 200 mg via ORAL
  Filled 2022-07-01: qty 1

## 2022-07-01 MED ORDER — FENTANYL CITRATE (PF) 100 MCG/2ML IJ SOLN
INTRAMUSCULAR | Status: AC
  Filled 2022-07-01: qty 2

## 2022-07-01 MED ORDER — PROPOFOL 10 MG/ML IV BOLUS
INTRAVENOUS | Status: AC
  Filled 2022-07-01: qty 20

## 2022-07-01 MED ORDER — BUPIVACAINE LIPOSOME 1.3 % IJ SUSP
INTRAMUSCULAR | Status: AC
  Filled 2022-07-01: qty 20

## 2022-07-01 MED ORDER — FENTANYL CITRATE (PF) 250 MCG/5ML IJ SOLN
INTRAMUSCULAR | Status: DC | PRN
  Administered 2022-07-01: 100 ug via INTRAVENOUS

## 2022-07-01 MED ORDER — LIDOCAINE HCL (PF) 2 % IJ SOLN
INTRAMUSCULAR | Status: AC
  Filled 2022-07-01: qty 5

## 2022-07-01 MED ORDER — LACTATED RINGERS IV SOLN: INTRAVENOUS | Status: DC

## 2022-07-01 MED ORDER — DEXAMETHASONE SODIUM PHOSPHATE 10 MG/ML IJ SOLN
INTRAMUSCULAR | Status: DC | PRN
  Administered 2022-07-01: 10 mg via INTRAVENOUS

## 2022-07-01 MED ORDER — BUPIVACAINE-EPINEPHRINE (PF) 0.5% -1:200000 IJ SOLN
INTRAMUSCULAR | Status: DC | PRN
  Administered 2022-07-01: 30 mL

## 2022-07-01 MED ORDER — OXYCODONE HCL 5 MG PO TABS: 5.0000 mg | ORAL_TABLET | Freq: Once | ORAL | Status: DC | PRN

## 2022-07-01 MED ORDER — SUGAMMADEX SODIUM 500 MG/5ML IV SOLN
INTRAVENOUS | Status: AC
  Filled 2022-07-01: qty 5

## 2022-07-01 MED ORDER — OXYCODONE HCL 5 MG/5ML PO SOLN: 5.0000 mg | Freq: Once | ORAL | Status: DC | PRN

## 2022-07-01 MED ORDER — ORAL CARE MOUTH RINSE: 15.0000 mL | Freq: Once | OROMUCOSAL | Status: AC

## 2022-07-01 MED ORDER — GLYCOPYRROLATE 0.2 MG/ML IJ SOLN
INTRAMUSCULAR | Status: DC | PRN
  Administered 2022-07-01: .2 mg via INTRAVENOUS

## 2022-07-01 MED ORDER — FENTANYL CITRATE PF 50 MCG/ML IJ SOSY: 25.0000 ug | PREFILLED_SYRINGE | INTRAMUSCULAR | Status: DC | PRN

## 2022-07-01 MED ORDER — MIDAZOLAM HCL 5 MG/5ML IJ SOLN
INTRAMUSCULAR | Status: DC | PRN
  Administered 2022-07-01: 2 mg via INTRAVENOUS

## 2022-07-01 MED ORDER — SODIUM CHLORIDE 0.9% FLUSH: 3.0000 mL | Freq: Two times a day (BID) | INTRAVENOUS | Status: DC

## 2022-07-01 MED ORDER — MIDAZOLAM HCL 2 MG/2ML IJ SOLN
INTRAMUSCULAR | Status: AC
  Filled 2022-07-01: qty 2

## 2022-07-01 MED ORDER — DEXAMETHASONE SODIUM PHOSPHATE 10 MG/ML IJ SOLN
INTRAMUSCULAR | Status: AC
Start: 2022-07-01 — End: ?
  Filled 2022-07-01: qty 1

## 2022-07-01 MED ORDER — BUPIVACAINE-EPINEPHRINE (PF) 0.5% -1:200000 IJ SOLN
INTRAMUSCULAR | Status: AC
  Filled 2022-07-01: qty 30

## 2022-07-01 MED ORDER — 0.9 % SODIUM CHLORIDE (POUR BTL) OPTIME
TOPICAL | Status: DC | PRN
  Administered 2022-07-01: 1000 mL

## 2022-07-01 MED ORDER — GLYCOPYRROLATE 0.2 MG/ML IJ SOLN
INTRAMUSCULAR | Status: AC
  Filled 2022-07-01: qty 1

## 2022-07-01 MED ORDER — OXYCODONE-ACETAMINOPHEN 5-325 MG PO TABS: 1.0000 | ORAL_TABLET | Freq: Four times a day (QID) | ORAL | 0 refills | Status: DC | PRN

## 2022-07-01 MED ORDER — ROCURONIUM BROMIDE 10 MG/ML (PF) SYRINGE
PREFILLED_SYRINGE | INTRAVENOUS | Status: DC | PRN
  Administered 2022-07-01: 60 mg via INTRAVENOUS

## 2022-07-01 MED ORDER — CHLORHEXIDINE GLUCONATE 0.12 % MT SOLN
15.0000 mL | Freq: Once | OROMUCOSAL | Status: AC
  Administered 2022-07-01: 15 mL via OROMUCOSAL

## 2022-07-01 SURGICAL SUPPLY — 43 items
BAG COUNTER SPONGE SURGICOUNT (BAG) IMPLANT
BENZOIN TINCTURE PRP APPL 2/3 (GAUZE/BANDAGES/DRESSINGS) ×1 IMPLANT
BLADE SURG 15 STRL LF DISP TIS (BLADE) IMPLANT
BLADE SURG 15 STRL SS (BLADE) ×1
BLADE SURG SZ10 CARB STEEL (BLADE) ×1 IMPLANT
COVER MAYO STAND STRL (DRAPES) IMPLANT
COVER SURGICAL LIGHT HANDLE (MISCELLANEOUS) ×1 IMPLANT
DRAPE UTILITY XL STRL (DRAPES) ×1 IMPLANT
DRSG PAD ABDOMINAL 8X10 ST (GAUZE/BANDAGES/DRESSINGS) IMPLANT
DRSG VASELINE 3X18 (GAUZE/BANDAGES/DRESSINGS) IMPLANT
ELECT REM PT RETURN 15FT ADLT (MISCELLANEOUS) ×1 IMPLANT
GAUZE 4X4 16PLY ~~LOC~~+RFID DBL (SPONGE) IMPLANT
GAUZE SPONGE 4X4 12PLY STRL (GAUZE/BANDAGES/DRESSINGS) IMPLANT
GLOVE BIO SURGEON STRL SZ 6.5 (GLOVE) ×1 IMPLANT
GLOVE BIOGEL PI IND STRL 7.0 (GLOVE) ×1 IMPLANT
GLOVE BIOGEL PI INDICATOR 7.0 (GLOVE) ×1
GLOVE INDICATOR 6.5 STRL GRN (GLOVE) ×1 IMPLANT
GOWN STRL REUS W/ TWL XL LVL3 (GOWN DISPOSABLE) ×2 IMPLANT
GOWN STRL REUS W/TWL XL LVL3 (GOWN DISPOSABLE) ×2
KIT BASIN OR (CUSTOM PROCEDURE TRAY) ×1 IMPLANT
KIT TURNOVER KIT A (KITS) IMPLANT
LEGGING LITHOTOMY PAIR STRL (DRAPES) IMPLANT
LOOP VESSEL MAXI BLUE (MISCELLANEOUS) IMPLANT
NDL HYPO 25X1 1.5 SAFETY (NEEDLE) ×1 IMPLANT
NDL SAFETY ECLIPSE 18X1.5 (NEEDLE) IMPLANT
NEEDLE HYPO 18GX1.5 SHARP (NEEDLE)
NEEDLE HYPO 25X1 1.5 SAFETY (NEEDLE) ×1 IMPLANT
PACK LITHOTOMY IV (CUSTOM PROCEDURE TRAY) ×1 IMPLANT
PANTS MESH DISP LRG (UNDERPADS AND DIAPERS) ×2 IMPLANT
PENCIL SMOKE EVACUATOR (MISCELLANEOUS) IMPLANT
SPIKE FLUID TRANSFER (MISCELLANEOUS) ×1 IMPLANT
SPONGE SURGIFOAM ABS GEL 12-7 (HEMOSTASIS) IMPLANT
SUT CHROMIC 2 0 SH (SUTURE) IMPLANT
SUT ETHIBOND 0 (SUTURE) IMPLANT
SUT GUT CHROMIC 3 0 (SUTURE) IMPLANT
SUT MON AB 3-0 SH 27 (SUTURE)
SUT MON AB 3-0 SH27 (SUTURE) ×1 IMPLANT
SUT VIC AB 4-0 P-3 18XBRD (SUTURE) IMPLANT
SUT VIC AB 4-0 P3 18 (SUTURE)
SYR CONTROL 10ML LL (SYRINGE) ×1 IMPLANT
TOWEL OR 17X26 10 PK STRL BLUE (TOWEL DISPOSABLE) ×1 IMPLANT
TOWEL OR NON WOVEN STRL DISP B (DISPOSABLE) ×1 IMPLANT
mesh briefs IMPLANT

## 2022-07-01 NOTE — H&P (Signed)
PROVIDER:  Elenora Gamma, MD   MRN: [id number] DOB: 04-25-1981 DATE OF ENCOUNTER: 06/15/2022 Interval History:    Johnathan Lee is a 41 y.o. male who underwent an I&D in the ER on 05/12/2022.  CT scan stated findings suspicious for perianal fistula arising from the mid to lower anal canal/sphincter complex and extending into the gluteal cleft.  CT recommended further evaluation as warranted with MRI and possibly colorectal surgical evaluation.  He was discharged on 05/14/2022 with 7 days Augmentin.   He states he is still having pain in the area.  He states he feels pain when he needs to have a bowel movement but then sometimes is unable to have a bowel movement.  He denies fever.  He states it has been draining constantly since the I&D   Review of Systems:        Past Medical History:  Diagnosis Date   Anxiety     Arthritis     Hyperlipidemia     Hypertension     Sleep apnea      History reviewed. No pertinent surgical history. History reviewed. No pertinent family history. Social History  Social History         Socioeconomic History   Marital status: Married  Tobacco Use   Smoking status: Former      Types: Cigarettes      Quit date: 04/2022      Years since quitting: 0.1   Smokeless tobacco: Never  Substance and Sexual Activity   Alcohol use: Not Currently   Drug use: Never          All other systems reviewed and are negative.   Medications:          Current Outpatient Medications on File Prior to Visit  Medication Sig Dispense Refill   amLODIPine (NORVASC) 5 MG tablet 1 tablet       buPROPion (WELLBUTRIN SR) 100 MG SR tablet Take 100 mg by mouth 2 (two) times daily       hydroCHLOROthiazide (HYDRODIURIL) 25 MG tablet TAKE 1 TABLET BY MOUTH ONCE A DAY IN THE MORNING       losartan (COZAAR) 50 MG tablet 1 tablet        No current facility-administered medications on file prior to visit.      Physical Examination:    There were no vitals taken for this  visit. General: Well-developed, well-nourished, in no acute distress.   CV: RRR Lungs: CTA Rectal: Fistula with external opening at posterior midline arising in a posterior skin tag.   Assessment and Plan:    Diagnoses and all orders for this visit:   Anal fistula     He is status post bedside I&D of perianal abscess in the ER on 05/12/2022.  On exam today he is still draining purulence.  We discussed that there is a possibility that this will close over the next couple months but I think the likelihood of this is very low.  We discussed proceeding with surgical evaluation.  I recommended anal exam under anesthesia with fistulotomy versus seton placement.  Patient does have a history of some chronic loose stools but this is better at the moment.  We discussed the possibility of incontinence with a fistulotomy and avoiding too much transection of the internal anal sphincter.  We discussed the need for additional surgery if a seton is placed.  We discussed typical time off work which for him will be most likely 1 to 3 weeks.  We  discussed that due to the location of the wound and his body habitus, if a fistulotomy is performed, this may be difficult to heal.     No follow-ups on file.   Vanita Panda, MD Colon and Rectal Surgery [facility]     Electronically signed by Elenora Gamma, MD on 06/15/2022 11:46 AM

## 2022-07-01 NOTE — Anesthesia Procedure Notes (Signed)
Procedure Name: Intubation Date/Time: 07/01/2022 9:42 AM  Performed by: Leeon Makar D, CRNAPre-anesthesia Checklist: Patient identified, Emergency Drugs available, Suction available and Patient being monitored Patient Re-evaluated:Patient Re-evaluated prior to induction Oxygen Delivery Method: Circle system utilized Preoxygenation: Pre-oxygenation with 100% oxygen Induction Type: IV induction Ventilation: Mask ventilation without difficulty Laryngoscope Size: Mac and 4 Grade View: Grade III Tube type: Oral Tube size: 7.5 mm Number of attempts: 1 Airway Equipment and Method: Stylet and Oral airway Placement Confirmation: ETT inserted through vocal cords under direct vision, positive ETCO2 and breath sounds checked- equal and bilateral Secured at: 22 cm Tube secured with: Tape Dental Injury: Teeth and Oropharynx as per pre-operative assessment

## 2022-07-01 NOTE — Anesthesia Postprocedure Evaluation (Signed)
Anesthesia Post Note  Patient: Ryu Cerreta  Procedure(s) Performed: RECTAL EXAM UNDER ANESTHESIA (Rectum) FISTULOTOMY (Rectum)     Patient location during evaluation: PACU Anesthesia Type: General Level of consciousness: awake and alert Pain management: pain level controlled Vital Signs Assessment: post-procedure vital signs reviewed and stable Respiratory status: spontaneous breathing, nonlabored ventilation, respiratory function stable and patient connected to nasal cannula oxygen Cardiovascular status: blood pressure returned to baseline and stable Postop Assessment: no apparent nausea or vomiting Anesthetic complications: no   No notable events documented.  Last Vitals:  Vitals:   07/01/22 1100 07/01/22 1115  BP: (!) 143/96 (!) 159/96  Pulse: 86 85  Resp: 20 18  Temp: 36.7 C 36.7 C  SpO2: 100% 100%    Last Pain:  Vitals:   07/01/22 1115  TempSrc:   PainSc: 0-No pain                 Keyira Mondesir

## 2022-07-01 NOTE — Discharge Instructions (Signed)

## 2022-07-01 NOTE — Op Note (Signed)
07/01/2022  10:19 AM  PATIENT:  Johnathan Lee  41 y.o. male  Patient Care Team: Delma Officer, PA as PCP - General (Internal Medicine)  PRE-OPERATIVE DIAGNOSIS:  ANAL FISTULA  POST-OPERATIVE DIAGNOSIS:  ANAL FISTULA  PROCEDURE:  RECTAL EXAM UNDER ANESTHESIA, INTERROGATION OF FISTULA TRACT FISTULOTOMY   Surgeon(s): Romie Levee, MD  ASSISTANT: none   ANESTHESIA:   local and general  SPECIMEN:  No Specimen  DISPOSITION OF SPECIMEN:  N/A  COUNTS:  YES  PLAN OF CARE: Discharge to home after PACU  PATIENT DISPOSITION:  PACU - hemodynamically stable.  INDICATION: 41 y.o. M with chronically draining rectal wound. A fistula was suspected   OR FINDINGS: Shallow posterior midline anal fistula  DESCRIPTION: the patient was identified in the preoperative holding area and taken to the OR where they were laid on the operating room table.  General anesthesia was induced without difficulty. The patient was then positioned in prone jackknife position with buttocks gently taped apart.  The patient was then prepped and draped in usual sterile fashion.  SCDs were noted to be in place prior to the initiation of anesthesia. A surgical timeout was performed indicating the correct patient, procedure, positioning and need for preoperative antibiotics.  A rectal block was performed using Marcaine with epinephrine.    I began with a digital rectal exam.  The patient had sphincter hypertension.  The anal canal was gently dilated to 2 fingerbreadths.  I then placed a Hill-Ferguson anoscope into the anal canal and evaluated this completely.  Anal canal appeared normal.  No obvious hemorrhoid disease.  Initiate fistula probe was inserted into the posterior midline opening.  This exited the anal canal very distally.  A fistulotomy was performed.  There was minimal to no sphincter involvement.  The overlying skin tag was resected.  The edges of these incision were marsupialized to the fistula tract  using a running 2-0 chromic suture.  Additional Marcaine was placed around the fistulotomy site.  The patient was then awakened from anesthesia and sent to the postanesthesia care unit in stable condition.  All counts were correct per operating room staff.  Vanita Panda, MD  Colorectal and General Surgery [facility]

## 2022-07-01 NOTE — Transfer of Care (Signed)
Immediate Anesthesia Transfer of Care Note  Patient: Johnathan Lee  Procedure(s) Performed: RECTAL EXAM UNDER ANESTHESIA (Rectum) FISTULOTOMY (Rectum)  Patient Location: PACU  Anesthesia Type:General  Level of Consciousness: awake, alert  and oriented  Airway & Oxygen Therapy: Patient Spontanous Breathing and Patient connected to face mask oxygen  Post-op Assessment: Report given to RN and Post -op Vital signs reviewed and stable  Post vital signs: Reviewed and stable  Last Vitals:  Vitals Value Taken Time  BP 214/106 07/01/22 1031  Temp    Pulse 92 07/01/22 1033  Resp 12 07/01/22 1033  SpO2 100 % 07/01/22 1033  Vitals shown include unvalidated device data.  Last Pain:  Vitals:   07/01/22 0821  TempSrc: Oral  PainSc: 0-No pain         Complications: No notable events documented.

## 2022-07-01 NOTE — Interval H&P Note (Signed)
History and Physical Interval Note:  07/01/2022 8:33 AM  Johnathan Lee  has presented today for surgery, with the diagnosis of ANAL FISTULA.  The various methods of treatment have been discussed with the patient and family. After consideration of risks, benefits and other options for treatment, the patient has consented to  Procedure(s): RECTAL EXAM UNDER ANESTHESIA (N/A) FISTULOTOMY VS SETON (N/A) as a surgical intervention.  The patient's history has been reviewed, patient examined, no change in status, stable for surgery.  I have reviewed the patient's chart and labs.  Questions were answered to the patient's satisfaction.     Vanita Panda, MD  Colorectal and General Surgery [facility]

## 2022-07-01 NOTE — Anesthesia Preprocedure Evaluation (Signed)
Anesthesia Evaluation  Patient identified by MRN, date of birth, ID band Patient awake    Reviewed: Allergy & Precautions, NPO status , Patient's Chart, lab work & pertinent test results  History of Anesthesia Complications Negative for: history of anesthetic complications  Airway Mallampati: IV  TM Distance: >3 FB Neck ROM: Full  Mouth opening: Limited Mouth Opening  Dental  (+) Dental Advisory Given, Poor Dentition   Pulmonary shortness of breath, asthma , sleep apnea , former smoker,    breath sounds clear to auscultation       Cardiovascular hypertension, Pt. on medications (-) angina(-) Past MI  Rhythm:Regular     Neuro/Psych negative neurological ROS  negative psych ROS   GI/Hepatic negative GI ROS, Neg liver ROS,   Endo/Other  Morbid obesity  Renal/GU negative Renal ROS     Musculoskeletal  (+) Arthritis ,   Abdominal   Peds  Hematology negative hematology ROS (+)   Anesthesia Other Findings   Reproductive/Obstetrics                             Anesthesia Physical Anesthesia Plan  ASA: 3  Anesthesia Plan: General   Post-op Pain Management:    Induction: Intravenous  PONV Risk Score and Plan: 2 and Ondansetron and Dexamethasone  Airway Management Planned: Oral ETT and Video Laryngoscope Planned  Additional Equipment: None  Intra-op Plan:   Post-operative Plan: Extubation in OR  Informed Consent: I have reviewed the patients History and Physical, chart, labs and discussed the procedure including the risks, benefits and alternatives for the proposed anesthesia with the patient or authorized representative who has indicated his/her understanding and acceptance.     Dental advisory given  Plan Discussed with: CRNA  Anesthesia Plan Comments:         Anesthesia Quick Evaluation

## 2022-07-02 ENCOUNTER — Encounter (HOSPITAL_COMMUNITY): Payer: Self-pay | Admitting: General Surgery

## 2022-07-24 ENCOUNTER — Ambulatory Visit (HOSPITAL_COMMUNITY)
Admission: EM | Admit: 2022-07-24 | Discharge: 2022-07-24 | Disposition: A | Discharge status: Home / Self Care | Payer: No Typology Code available for payment source | Attending: Internal Medicine | Admitting: Internal Medicine

## 2022-07-24 ENCOUNTER — Encounter (HOSPITAL_COMMUNITY): Payer: Self-pay | Admitting: Emergency Medicine

## 2022-07-24 DIAGNOSIS — M545 Low back pain, unspecified: Secondary | ICD-10-CM

## 2022-07-24 MED ORDER — ACETAMINOPHEN 325 MG PO TABS
ORAL_TABLET | ORAL | Status: AC
  Filled 2022-07-24: qty 3

## 2022-07-24 MED ORDER — CYCLOBENZAPRINE HCL 10 MG PO TABS: 10.0000 mg | ORAL_TABLET | Freq: Two times a day (BID) | ORAL | 0 refills | Status: DC | PRN

## 2022-07-24 MED ORDER — KETOROLAC TROMETHAMINE 30 MG/ML IJ SOLN
INTRAMUSCULAR | Status: AC
  Filled 2022-07-24: qty 1

## 2022-07-24 MED ORDER — ACETAMINOPHEN 325 MG PO TABS
975.0000 mg | ORAL_TABLET | Freq: Once | ORAL | Status: AC
  Administered 2022-07-24: 975 mg via ORAL

## 2022-07-24 MED ORDER — KETOROLAC TROMETHAMINE 30 MG/ML IJ SOLN
30.0000 mg | Freq: Once | INTRAMUSCULAR | Status: AC
Start: 2022-07-24 — End: 2022-07-24
  Administered 2022-07-24: 30 mg via INTRAMUSCULAR
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
## 2022-07-24 NOTE — Discharge Instructions (Addendum)
Gave you an injection of ketorolac and a dose of Tylenol in the clinic today to help with your low back pain and neck pain.   You may start taking 800 mg ibuprofen and 1000 mg of Tylenol every 8 hours as needed for pain and inflammation starting tonight at approximately 11 PM since you were given the injection in clinic today.   You may also take Flexeril muscle relaxer every 12 hours as needed for muscle spasm.  Do not take this medicine and drive or drink alcohol as it can make you very sleepy.   Perform gentle warm compresses and exercises to your back and neck to help prevent muscle stiffness and to relieve pain.  Follow-up with your PCP in the next week or 2 for reevaluation if your symptoms do not improve.   If you develop any new or worsening symptoms or do not improve in the next 2 to 3 days, please return.  If your symptoms are severe, please go to the emergency room.  Follow-up with your primary care provider for further evaluation and management of your symptoms as well as ongoing wellness visits.  I hope you feel better!

## 2022-08-18 ENCOUNTER — Emergency Department (HOSPITAL_COMMUNITY): Payer: PRIVATE HEALTH INSURANCE

## 2022-08-18 ENCOUNTER — Emergency Department (HOSPITAL_COMMUNITY)
Admission: EM | Admit: 2022-08-18 | Discharge: 2022-08-18 | Discharge status: Against Medical Advice | Payer: PRIVATE HEALTH INSURANCE | Attending: Medical | Admitting: Medical

## 2022-08-18 ENCOUNTER — Encounter (HOSPITAL_COMMUNITY): Payer: Self-pay

## 2022-08-18 ENCOUNTER — Other Ambulatory Visit: Payer: Self-pay

## 2022-08-18 ENCOUNTER — Other Ambulatory Visit (HOSPITAL_COMMUNITY): Payer: Self-pay | Admitting: Student

## 2022-08-18 DIAGNOSIS — Z5321 Procedure and treatment not carried out due to patient leaving prior to being seen by health care provider: Secondary | ICD-10-CM | POA: Insufficient documentation

## 2022-08-18 DIAGNOSIS — K6289 Other specified diseases of anus and rectum: Secondary | ICD-10-CM

## 2022-08-18 DIAGNOSIS — Z1152 Encounter for screening for COVID-19: Secondary | ICD-10-CM | POA: Insufficient documentation

## 2022-08-18 LAB — BASIC METABOLIC PANEL
Anion gap: 6 (ref 5–15)
BUN: 11 mg/dL (ref 6–20)
CO2: 27 mmol/L (ref 22–32)
Calcium: 9.3 mg/dL (ref 8.9–10.3)
Chloride: 106 mmol/L (ref 98–111)
Creatinine, Ser: 0.81 mg/dL (ref 0.61–1.24)
GFR, Estimated: >60 mL/min (ref >60)
Glucose, Bld: 102 mg/dL — ABNORMAL HIGH (ref 70–99)
Potassium: 3.3 mmol/L — ABNORMAL LOW (ref 3.5–5.1)
Sodium: 139 mmol/L (ref 135–145)

## 2022-08-18 LAB — CBC WITH DIFFERENTIAL/PLATELET
Abs Immature Granulocytes: 0.05 10*3/uL (ref 0.00–0.07)
Basophils Absolute: 0.1 10*3/uL (ref 0.0–0.1)
Basophils Relative: 1 %
Eosinophils Absolute: 0.3 10*3/uL (ref 0.0–0.5)
Eosinophils Relative: 4 %
HCT: 44.4 % (ref 39.0–52.0)
Hemoglobin: 15.3 g/dL (ref 13.0–17.0)
Immature Granulocytes: 1 %
Lymphocytes Relative: 36 %
Lymphs Abs: 3.1 10*3/uL (ref 0.7–4.0)
MCH: 31.4 pg (ref 26.0–34.0)
MCHC: 34.5 g/dL (ref 30.0–36.0)
MCV: 91.2 fL (ref 80.0–100.0)
Monocytes Absolute: 1.1 10*3/uL — ABNORMAL HIGH (ref 0.1–1.0)
Monocytes Relative: 13 %
Neutro Abs: 4.1 10*3/uL (ref 1.7–7.7)
Neutrophils Relative %: 45 %
Platelets: 359 10*3/uL (ref 150–400)
RBC: 4.87 MIL/uL (ref 4.22–5.81)
RDW: 13.5 % (ref 11.5–15.5)
WBC: 8.7 10*3/uL (ref 4.0–10.5)
nRBC: 0 % (ref 0.0–0.2)

## 2022-08-18 LAB — RESP PANEL BY RT-PCR (FLU A&B, COVID) ARPGX2
Influenza A by PCR: NEGATIVE
Influenza B by PCR: NEGATIVE
SARS Coronavirus 2 by RT PCR: NEGATIVE

## 2022-08-18 MED ORDER — IOHEXOL 350 MG/ML SOLN
75.0000 mL | Freq: Once | INTRAVENOUS | Status: AC | PRN
  Administered 2022-08-18: 75 mL via INTRAVENOUS

## 2022-08-18 NOTE — ED Provider Triage Note (Signed)
Emergency Medicine Provider Triage Evaluation Note  Johnathan Lee , a 41 y.o. male  was evaluated in triage.  Pt complains of perirectal pain ongoing x1 month.  Gradually worsening.  Patient started on antibiotics by general surgery last week.  CT of the pelvis was ordered by general surgery 2 days ago patient has not yet had that study.  Review of Systems  Positive: Perirectal pain Negative: Fever, nausea, vomiting, hematuria, hematochezia.  Physical Exam  BP 137/79 (BP Location: Right Arm)   Pulse 89   Temp 98.4 F (36.9 C) (Oral)   Resp 16   SpO2 94%  Gen:   Awake, no distress   Resp:  Normal effort  MSK:   Moves extremities without difficulty   Medical Decision Making  Medically screening exam initiated at 11:59 AM.  Appropriate orders placed.  Johnathan Lee was informed that the remainder of the evaluation will be completed by another provider, this initial triage assessment does not replace that evaluation, and the importance of remaining in the ED until their evaluation is complete.  Note: Portions of this report may have been transcribed using voice recognition software. Every effort was made to ensure accuracy; however, inadvertent computerized transcription errors may still be present.    Deliah Boston, PA-C 08/18/22 1201
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
## 2022-08-18 NOTE — ED Notes (Signed)
Unable to get labs in triage x2

## 2022-08-18 NOTE — ED Notes (Signed)
Pt no longer wanted to wait  

## 2022-09-20 ENCOUNTER — Other Ambulatory Visit: Payer: Self-pay

## 2022-09-20 ENCOUNTER — Emergency Department (HOSPITAL_COMMUNITY)
Admission: EM | Admit: 2022-09-20 | Discharge: 2022-09-20 | Disposition: A | Discharge status: Home / Self Care | Payer: No Typology Code available for payment source | Attending: Emergency Medicine | Admitting: Emergency Medicine

## 2022-09-20 ENCOUNTER — Emergency Department (HOSPITAL_COMMUNITY): Payer: No Typology Code available for payment source

## 2022-09-20 DIAGNOSIS — Z87891 Personal history of nicotine dependence: Secondary | ICD-10-CM | POA: Insufficient documentation

## 2022-09-20 DIAGNOSIS — J45909 Unspecified asthma, uncomplicated: Secondary | ICD-10-CM | POA: Insufficient documentation

## 2022-09-20 DIAGNOSIS — I10 Essential (primary) hypertension: Secondary | ICD-10-CM | POA: Diagnosis not present

## 2022-09-20 DIAGNOSIS — R1030 Lower abdominal pain, unspecified: Secondary | ICD-10-CM | POA: Diagnosis present

## 2022-09-20 DIAGNOSIS — N410 Acute prostatitis: Secondary | ICD-10-CM | POA: Insufficient documentation

## 2022-09-20 DIAGNOSIS — Z79899 Other long term (current) drug therapy: Secondary | ICD-10-CM | POA: Diagnosis not present

## 2022-09-20 LAB — CBC WITH DIFFERENTIAL/PLATELET
Abs Immature Granulocytes: 0.05 10*3/uL (ref 0.00–0.07)
Basophils Absolute: 0.1 10*3/uL (ref 0.0–0.1)
Basophils Relative: 1 %
Eosinophils Absolute: 0.3 10*3/uL (ref 0.0–0.5)
Eosinophils Relative: 4 %
HCT: 43.2 % (ref 39.0–52.0)
Hemoglobin: 14.8 g/dL (ref 13.0–17.0)
Immature Granulocytes: 1 %
Lymphocytes Relative: 41 %
Lymphs Abs: 3.3 10*3/uL (ref 0.7–4.0)
MCH: 31.4 pg (ref 26.0–34.0)
MCHC: 34.3 g/dL (ref 30.0–36.0)
MCV: 91.7 fL (ref 80.0–100.0)
Monocytes Absolute: 0.9 10*3/uL (ref 0.1–1.0)
Monocytes Relative: 11 %
Neutro Abs: 3.2 10*3/uL (ref 1.7–7.7)
Neutrophils Relative %: 42 %
Platelets: 340 10*3/uL (ref 150–400)
RBC: 4.71 MIL/uL (ref 4.22–5.81)
RDW: 13.3 % (ref 11.5–15.5)
WBC: 7.8 10*3/uL (ref 4.0–10.5)
nRBC: 0 % (ref 0.0–0.2)

## 2022-09-20 LAB — COMPREHENSIVE METABOLIC PANEL
ALT: 65 U/L — ABNORMAL HIGH (ref 0–44)
AST: 35 U/L (ref 15–41)
Albumin: 3.9 g/dL (ref 3.5–5.0)
Alkaline Phosphatase: 66 U/L (ref 38–126)
Anion gap: 11 (ref 5–15)
BUN: 6 mg/dL (ref 6–20)
CO2: 27 mmol/L (ref 22–32)
Calcium: 9.3 mg/dL (ref 8.9–10.3)
Chloride: 101 mmol/L (ref 98–111)
Creatinine, Ser: 0.95 mg/dL (ref 0.61–1.24)
GFR, Estimated: >60 mL/min (ref >60)
Glucose, Bld: 93 mg/dL (ref 70–99)
Potassium: 3.1 mmol/L — ABNORMAL LOW (ref 3.5–5.1)
Sodium: 139 mmol/L (ref 135–145)
Total Bilirubin: 0.3 mg/dL (ref 0.3–1.2)
Total Protein: 7.4 g/dL (ref 6.5–8.1)

## 2022-09-20 LAB — URINALYSIS, ROUTINE W REFLEX MICROSCOPIC
Bilirubin Urine: NEGATIVE
Glucose, UA: NEGATIVE mg/dL
Hgb urine dipstick: NEGATIVE
Ketones, ur: NEGATIVE mg/dL
Leukocytes,Ua: NEGATIVE
Nitrite: NEGATIVE
Protein, ur: NEGATIVE mg/dL
Specific Gravity, Urine: 1.002 — ABNORMAL LOW (ref 1.005–1.030)
pH: 7 (ref 5.0–8.0)

## 2022-09-20 LAB — LIPASE, BLOOD: Lipase: 27 U/L (ref 11–51)

## 2022-09-20 MED ORDER — IOHEXOL 350 MG/ML SOLN
75.0000 mL | Freq: Once | INTRAVENOUS | Status: AC | PRN
  Administered 2022-09-20: 75 mL via INTRAVENOUS

## 2022-09-20 MED ORDER — CIPROFLOXACIN HCL 500 MG PO TABS: 500.0000 mg | ORAL_TABLET | Freq: Two times a day (BID) | ORAL | 0 refills | Status: AC

## 2022-09-20 NOTE — ED Provider Triage Note (Signed)
Emergency Medicine Provider Triage Evaluation Note  Johnathan Lee , a 41 y.o. male  was evaluated in triage.  Pt complains of left lower quadrant abdominal pain.  Was told he possibly had prostatitis previously.  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  He is also having some rectal pain and some pain with bowel movements.  Some dysuria without hematuria.  Also has some occasional left flank pain.  He was referred to urology however has not followed up with them as of yet.  Review of Systems  Positive: Dysuria, pain with bowel movements, left lower quadrant abdominal pain Negative: Fever, hematuria  Physical Exam  BP 134/84   Pulse 74   Temp 98.4 F (36.9 C) (Oral)   Resp 16   SpO2 98%  Gen:   Awake, no distress   Resp:  Normal effort  MSK:   Moves extremities without difficulty  ABD:  Tenderness to LLQ, supapubic region Other:    Medical Decision Making  Medically screening exam initiated at 11:48 AM.  Appropriate orders placed.  Jud Fanguy was informed that the remainder of the evaluation will be completed by another provider, this initial triage assessment does not replace that evaluation, and the importance of remaining in the ED until their evaluation is complete.  Abd pain   Kirstin Kugler A, PA-C 09/20/22 1150

## 2022-09-20 NOTE — ED Provider Notes (Signed)
[facility] EMERGENCY DEPARTMENT Provider Note  CSN: [id number] Arrival date & time: 09/20/22 1116  Chief Complaint(s) Abdominal Pain  HPI Johnathan Lee is a 41 y.o. male with history of perianal abscess, obesity, hypertension presenting to the emergency department with rectal pain.  Patient reports rectal pain and lower abdominal pain.  He reports some pain with bowel movements and occasional dysuria.  No nausea, vomiting.  No fevers or chills.  No chest pain or shortness of breath.  No melena, black stools, hematochezia.  Symptoms are mild   Past Medical History Past Medical History:  Diagnosis Date   Asthma    Former smoker    Hypertension    Obesity    Osteoarthritis    Sleep apnea    Patient Active Problem List   Diagnosis Date Noted   Perianal abscess 05/12/2022   HTN (hypertension) 05/12/2022   Hypokalemia 05/12/2022   Home Medication(s) Prior to Admission medications   Medication Sig Start Date End Date Taking? Authorizing Provider  amLODipine (NORVASC) 5 MG tablet Take 5 mg by mouth daily.   Yes [provider]  buPROPion (WELLBUTRIN SR) 150 MG 12 hr tablet Take 150 mg by mouth 2 (two) times daily.   Yes [provider]  carvedilol (COREG) 6.25 MG tablet Take 6.25 mg by mouth 2 (two) times daily with a meal.   Yes [provider]  chlorthalidone (HYGROTON) 25 MG tablet Take 25 mg by mouth every morning. 08/11/22  Yes [provider]  ciprofloxacin (CIPRO) 500 MG tablet Take 1 tablet (500 mg total) by mouth 2 (two) times daily for 28 days. 09/20/22 10/18/22 Yes Lonell Grandchild, MD  cyclobenzaprine (FLEXERIL) 10 MG tablet Take 1 tablet (10 mg total) by mouth 2 (two) times daily as needed for muscle spasms. 07/24/22  Yes Carlisle Beers, FNP  ibuprofen (ADVIL) 200 MG tablet Take 200 mg by mouth every 6 (six) hours as needed for mild pain (pain).   Yes [provider]  losartan (COZAAR) 50 MG tablet  Take 50 mg by mouth daily.   Yes [provider]  potassium chloride SA (KLOR-CON M) 20 MEQ tablet Take 20 mEq by mouth 2 (two) times daily.   Yes [provider]  docusate sodium (COLACE) 100 MG capsule Take 1 capsule (100 mg total) by mouth 2 (two) times daily. Patient not taking: Reported on 06/21/2022 05/14/22   Osvaldo Shipper, MD  hydrochlorothiazide (HYDRODIURIL) 25 MG tablet TAKE 1 TABLET BY MOUTH ONCE A DAY IN THE MORNING Patient not taking: Reported on 09/20/2022 02/28/22 02/28/23  Raspet, Noberto Retort, PA-C  ibuprofen (ADVIL) 400 MG tablet Take 1 tablet (400 mg total) by mouth every 4 (four) hours as needed for mild pain. Patient not taking: Reported on 06/21/2022 05/14/22   Osvaldo Shipper, MD  oxyCODONE-acetaminophen (PERCOCET/ROXICET) 5-325 MG tablet Take 1-2 tablets by mouth every 6 (six) hours as needed for severe pain. Patient not taking: Reported on 09/20/2022 07/01/22   Romie Levee, MD  Past Surgical History Past Surgical History:  Procedure Laterality Date   FISTULOTOMY N/A 07/01/2022   Procedure: FISTULOTOMY;  Surgeon: Leighton Ruff, MD;  Location: [facility];  Service: General;  Laterality: N/A;   NO PAST SURGERIES     RECTAL EXAM UNDER ANESTHESIA N/A 07/01/2022   Procedure: RECTAL EXAM UNDER ANESTHESIA;  Surgeon: Leighton Ruff, MD;  Location: [facility];  Service: General;  Laterality: N/A;   Family History No family history on file.  Social History Social History   Tobacco Use   Smoking status: Former    Packs/day: 0.50    Years: 20.00    Total pack years: 10.00    Types: Cigarettes, Cigars    Quit date: 04/26/2022    Years since quitting: 0.4   Smokeless tobacco: Never  Vaping Use   Vaping Use: Never used  Substance Use Topics   Alcohol use: Not Currently   Drug use: Never   Allergies Patient has no known allergies.  Review  of Systems Review of Systems  Physical Exam Vital Signs  I have reviewed the triage vital signs BP (!) 134/93 (BP Location: Left Arm)   Pulse 73   Temp 98.1 F (36.7 C) (Oral)   Resp 19   SpO2 96%  Physical Exam Vitals and nursing note reviewed.  Constitutional:      General: He is not in acute distress.    Appearance: Normal appearance. He is obese.  HENT:     Mouth/Throat:     Mouth: Mucous membranes are moist.  Eyes:     Conjunctiva/sclera: Conjunctivae normal.  Cardiovascular:     Rate and Rhythm: Normal rate and regular rhythm.  Pulmonary:     Effort: Pulmonary effort is normal. No respiratory distress.     Breath sounds: Normal breath sounds.  Abdominal:     General: Abdomen is flat.     Palpations: Abdomen is soft.     Tenderness: There is no abdominal tenderness.  Genitourinary:    Comments: Chaperoned by RN.  External rectal exam normal.  Internal rectal exam with significant prostate tenderness Musculoskeletal:     Right lower leg: No edema.     Left lower leg: No edema.  Skin:    General: Skin is warm and dry.     Capillary Refill: Capillary refill takes less than 2 seconds.  Neurological:     Mental Status: He is alert and oriented to person, place, and time. Mental status is at baseline.  Psychiatric:        Mood and Affect: Mood normal.        Behavior: Behavior normal.     ED Results and Treatments Labs (all labs ordered are listed, but only abnormal results are displayed) Labs Reviewed  COMPREHENSIVE METABOLIC PANEL - Abnormal; Notable for the following components:      Result Value   Potassium 3.1 (*)    ALT 65 (*)    All other components within normal limits  URINALYSIS, ROUTINE W REFLEX MICROSCOPIC - Abnormal; Notable for the following components:   Color, Urine STRAW (*)    Specific Gravity, Urine 1.002 (*)    All other components within normal limits  CBC WITH DIFFERENTIAL/PLATELET  LIPASE, BLOOD  Radiology CT ABDOMEN PELVIS W CONTRAST  Result Date: 09/20/2022 CLINICAL DATA:  Left lower quadrant abdominal and rectal pain. History of prostatitis with perirectal abscess and fistula. EXAM: CT ABDOMEN AND PELVIS WITH CONTRAST TECHNIQUE: Multidetector CT imaging of the abdomen and pelvis was performed using the standard protocol following bolus administration of intravenous contrast. RADIATION DOSE REDUCTION: This exam was performed according to the departmental dose-optimization program which includes automated exposure control, adjustment of the mA and/or kV according to patient size and/or use of iterative reconstruction technique. CONTRAST:  58mL OMNIPAQUE IOHEXOL 350 MG/ML SOLN COMPARISON:  Pelvic CT 08/18/2022 and 05/12/2022. FINDINGS: Lower chest: Clear lung bases. No significant pleural or pericardial effusion. Small hiatal hernia. Hepatobiliary: The liver appears unremarkable as imaged in the noncontrast state. No evidence of gallstones, gallbladder wall thickening or biliary dilatation. Pancreas: Unremarkable. No pancreatic ductal dilatation or surrounding inflammatory changes. Spleen: Normal in size without focal abnormality. Adrenals/Urinary Tract: Both adrenal glands appear normal. No evidence of urinary tract calculus, suspicious renal lesion or hydronephrosis. The bladder appears unremarkable for its degree of distention. Stomach/Bowel: No enteric contrast administered. The stomach appears unremarkable for its degree of distension. No evidence of bowel wall thickening, distention or surrounding inflammatory change. The appendix appears normal. No recurrent perirectal inflammation, fistula or fluid collection identified. Vascular/Lymphatic: There are no enlarged abdominal or pelvic lymph nodes. No significant vascular findings on noncontrast imaging. Reproductive: Stable dystrophic calcifications within the  prostate gland. No surrounding inflammation identified. Other: No evidence of abdominal wall mass or hernia. No ascites. Musculoskeletal: No acute or significant osseous findings. Facet hypertrophy noted in the lower lumbar spine. IMPRESSION: 1. No acute findings or explanation for the patient's symptoms. 2. No recurrent perirectal inflammation, fistula or fluid collection identified. 3. Small hiatal hernia. Electronically Signed   By: Richardean Sale M.D.   On: 09/20/2022 16:28    Pertinent labs & imaging results that were available during my care of the patient were reviewed by me and considered in my medical decision making (see MDM for details).  Medications Ordered in ED Medications  iohexol (OMNIPAQUE) 350 MG/ML injection 75 mL (75 mLs Intravenous Contrast Given 09/20/22 1559)                                                                                                                                     Procedures Procedures  (including critical care time)  Medical Decision Making / ED Course   MDM:  41 year old male presenting to the emergency department with rectal pain.  Differential includes perirectal abscess, perianal abscess, anal fissure, fistula, prostatitis, rectal mass, proctitis.  Laboratory work-up overall reassuring.  Notable for minimal hypokalemia.  Urinalysis is reassuring.  CT scan performed to evaluate for above diagnosis with no acute process seen.  Direct rectal exam with normal external rectal with no masses, abscesses, fissures.  Patient does have prostate tenderness on internal rectal exam concerning for prostatitis.  We will start on Cipro.  Patient reportedly already has follow-up with urology. Will discharge patient to home. All questions answered. Patient comfortable with plan of discharge. Return precautions discussed with patient and specified on the after visit summary.      Additional history obtained: -External records from outside source obtained  and reviewed including: Chart review including previous notes, labs, imaging, consultation notes including exam under anesthesia with surgery   Lab Tests: -I ordered, reviewed, and interpreted labs.   The pertinent results include:   Labs Reviewed  COMPREHENSIVE METABOLIC PANEL - Abnormal; Notable for the following components:      Result Value   Potassium 3.1 (*)    ALT 65 (*)    All other components within normal limits  URINALYSIS, ROUTINE W REFLEX MICROSCOPIC - Abnormal; Notable for the following components:   Color, Urine STRAW (*)    Specific Gravity, Urine 1.002 (*)    All other components within normal limits  CBC WITH DIFFERENTIAL/PLATELET  LIPASE, BLOOD    Notable for normal UA, mild hypokalemia  Imaging Studies ordered: I ordered imaging studies including CT A/P On my interpretation imaging demonstrates no acute process I independently visualized and interpreted imaging. I agree with the radiologist interpretation   Medicines ordered and prescription drug management: Meds ordered this encounter  Medications   iohexol (OMNIPAQUE) 350 MG/ML injection 75 mL   ciprofloxacin (CIPRO) 500 MG tablet    Sig: Take 1 tablet (500 mg total) by mouth 2 (two) times daily for 28 days.    Dispense:  56 tablet    Refill:  0    -I have reviewed the patients home medicines and have made adjustments as needed   Social Determinants of Health:  Diagnosis or treatment significantly limited by social determinants of health: obesity   Reevaluation: After the interventions noted above, I reevaluated the patient and found that they have improved  Co morbidities that complicate the patient evaluation  Past Medical History:  Diagnosis Date   Asthma    Former smoker    Hypertension    Obesity    Osteoarthritis    Sleep apnea       Dispostion: Disposition decision including need for hospitalization was considered, and patient discharged from emergency department.    Final  Clinical Impression(s) / ED Diagnoses Final diagnoses:  Acute prostatitis     This chart was dictated using voice recognition software.  Despite best efforts to proofread,  errors can occur which can change the documentation meaning.    Cristie Hem, MD 09/20/22 2125

## 2022-09-20 NOTE — ED Triage Notes (Signed)
Pt reports LLQ and L flank pain. Seen for same previously and dx with prostatitis and set up with urology but appointment not until 11/29. Pain significantly worse today-now stabbing in nature.

## 2023-12-13 ENCOUNTER — Ambulatory Visit: Payer: Commercial Managed Care - PPO

## 2023-12-13 ENCOUNTER — Ambulatory Visit
Admission: EM | Admit: 2023-12-13 | Discharge: 2023-12-13 | Disposition: A | Discharge status: Home / Self Care | Payer: Commercial Managed Care - PPO | Attending: Internal Medicine | Admitting: Internal Medicine

## 2023-12-13 ENCOUNTER — Other Ambulatory Visit: Payer: Self-pay

## 2023-12-13 DIAGNOSIS — R000 Tachycardia, unspecified: Secondary | ICD-10-CM

## 2023-12-13 DIAGNOSIS — J189 Pneumonia, unspecified organism: Secondary | ICD-10-CM | POA: Diagnosis not present

## 2023-12-13 DIAGNOSIS — J101 Influenza due to other identified influenza virus with other respiratory manifestations: Secondary | ICD-10-CM

## 2023-12-13 DIAGNOSIS — R509 Fever, unspecified: Secondary | ICD-10-CM

## 2023-12-13 DIAGNOSIS — R0902 Hypoxemia: Secondary | ICD-10-CM

## 2023-12-13 DIAGNOSIS — R0682 Tachypnea, not elsewhere classified: Secondary | ICD-10-CM | POA: Diagnosis not present

## 2023-12-13 LAB — POCT INFLUENZA A/B
Influenza A, POC: POSITIVE — AB
Influenza B, POC: NEGATIVE

## 2023-12-13 MED ORDER — ALBUTEROL SULFATE HFA 108 (90 BASE) MCG/ACT IN AERS: 2.0000 | INHALATION_SPRAY | RESPIRATORY_TRACT | 0 refills | Status: DC | PRN

## 2023-12-13 MED ORDER — ALBUTEROL SULFATE (2.5 MG/3ML) 0.083% IN NEBU
2.5000 mg | INHALATION_SOLUTION | Freq: Once | RESPIRATORY_TRACT | Status: AC
  Administered 2023-12-13: 2.5 mg via RESPIRATORY_TRACT

## 2023-12-13 MED ORDER — METHYLPREDNISOLONE ACETATE 80 MG/ML IJ SUSP: 40.0000 mg | Freq: Once | INTRAMUSCULAR | Status: DC

## 2023-12-13 MED ORDER — OSELTAMIVIR PHOSPHATE 75 MG PO CAPS: 75.0000 mg | ORAL_CAPSULE | Freq: Two times a day (BID) | ORAL | 0 refills | Status: AC

## 2023-12-13 MED ORDER — PREDNISONE 20 MG PO TABS: 40.0000 mg | ORAL_TABLET | Freq: Every day | ORAL | 0 refills | Status: AC

## 2023-12-13 MED ORDER — IBUPROFEN 400 MG PO TABS
400.0000 mg | ORAL_TABLET | Freq: Once | ORAL | Status: AC
  Administered 2023-12-13: 400 mg via ORAL

## 2023-12-13 MED ORDER — DOXYCYCLINE HYCLATE 100 MG PO CAPS: 100.0000 mg | ORAL_CAPSULE | Freq: Two times a day (BID) | ORAL | 0 refills | Status: AC

## 2023-12-13 NOTE — ED Provider Notes (Signed)
 [facility]  Note:  This document was prepared using Dragon voice recognition software and may include unintentional dictation errors.  MRN: [id number] DOB: [birth year] DATE: 12/13/23   Subjective:  Chief Complaint:  Chief Complaint  Patient presents with   Fever     HPI: Johnathan Lee is a 43 y.o. male presenting for fever and dry cough for the past 2 days. Patient states that he started feeling unwell on Sunday evening. Reports developing a cough and congestion at that time. He states he started feeling worse on Monday. Reports myalgias and fevers. He states max temperature was 104.3 yesterday. He has taken Excedrin tylenol  with only temporary relief. Last dose was last night. Reports sick contact with his wife. Denies nausea/vomiting, abdominal pain, otalgia. Endorses fever, cough, congestion, shortness of breath, myalgias. On arrival oxygen was 90-91% and pulse was elevated in the 120s.  Prior to Admission medications   Medication Sig Start Date End Date Taking? Authorizing Provider  amLODipine  (NORVASC ) 5 MG tablet Take 5 mg by mouth daily.    [provider]  buPROPion (WELLBUTRIN SR) 150 MG 12 hr tablet Take 150 mg by mouth 2 (two) times daily.    [provider]  carvedilol (COREG) 6.25 MG tablet Take 6.25 mg by mouth 2 (two) times daily with a meal.    [provider]  chlorthalidone (HYGROTON) 25 MG tablet Take 25 mg by mouth every morning. 08/11/22   [provider]  hydrochlorothiazide  (HYDRODIURIL ) 25 MG tablet TAKE 1 TABLET BY MOUTH ONCE A DAY IN THE MORNING Patient not taking: Reported on 09/20/2022 02/28/22 02/28/23  Raspet, Erin K, PA-C  ibuprofen  (ADVIL ) 200 MG tablet Take 200 mg by mouth every 6 (six) hours as needed for mild pain (pain).    [provider]  ibuprofen  (ADVIL ) 400 MG tablet Take 1 tablet (400 mg total) by mouth every 4 (four) hours as needed for mild pain. Patient not taking: Reported on 06/21/2022 05/14/22    Krishnan, Gokul, MD  losartan (COZAAR) 50 MG tablet Take 50 mg by mouth daily.    [provider]     No Known Allergies  History:   Past Medical History:  Diagnosis Date   Asthma    Former smoker    Hypertension    Obesity    Osteoarthritis    Sleep apnea      Past Surgical History:  Procedure Laterality Date   FISTULOTOMY N/A 07/01/2022   Procedure: FISTULOTOMY;  Surgeon: Debby Hila, MD;  Location: [facility];  Service: General;  Laterality: N/A;   NO PAST SURGERIES     RECTAL EXAM UNDER ANESTHESIA N/A 07/01/2022   Procedure: RECTAL EXAM UNDER ANESTHESIA;  Surgeon: Debby Hila, MD;  Location: [facility];  Service: General;  Laterality: N/A;    History reviewed. No pertinent family history.  Social History   Tobacco Use   Smoking status: Former    Current packs/day: 0.00    Average packs/day: 0.5 packs/day for 20.0 years (10.0 ttl pk-yrs)    Types: Cigarettes, Cigars    Start date: 04/26/2002    Quit date: 04/26/2022    Years since quitting: 1.6   Smokeless tobacco: Never  Vaping Use   Vaping status: Never Used  Substance Use Topics   Alcohol use: Not Currently   Drug use: Never    Review of Systems  Constitutional:  Positive for chills, fatigue and fever.  HENT:  Positive for congestion, rhinorrhea and sore throat. Negative for ear pain.  Respiratory:  Positive for cough and shortness of breath.   Gastrointestinal:  Negative for abdominal pain, nausea and vomiting.  Musculoskeletal:  Positive for myalgias.  Neurological:  Positive for headaches.     Objective:   Vitals: BP (!) 158/105   Pulse (!) 123   Temp (!) 100.9 F (38.3 C) (Oral)   Resp (!) 30   SpO2 91%   Physical Exam Constitutional:      General: He is not in acute distress.    Appearance: Normal appearance. He is well-developed. He is morbidly obese. He is ill-appearing. He is not toxic-appearing.  HENT:     Head: Normocephalic and atraumatic.  Cardiovascular:     Rate and  Rhythm: Regular rhythm. Tachycardia present.     Heart sounds: Normal heart sounds.  Pulmonary:     Effort: Pulmonary effort is normal. Tachypnea present.     Breath sounds: Decreased air movement present. Examination of the right-middle field reveals rhonchi. Examination of the left-middle field reveals rhonchi. Examination of the right-lower field reveals rhonchi. Examination of the left-lower field reveals rhonchi. Rhonchi present.  Abdominal:     General: Bowel sounds are normal.     Palpations: Abdomen is soft.     Tenderness: There is no abdominal tenderness.  Skin:    General: Skin is warm and dry.  Neurological:     General: No focal deficit present.     Mental Status: He is alert.  Psychiatric:        Mood and Affect: Mood and affect normal.     Results:  Labs: Results for orders placed or performed during the hospital encounter of 12/13/23 (from the past 24 hours)  POCT Influenza A/B     Status: Abnormal   Collection Time: 12/13/23  9:03 AM  Result Value Ref Range   Influenza A, POC Positive (A) Negative   Influenza B, POC Negative Negative    Radiology: DG Chest 2 View Result Date: 12/13/2023 CLINICAL DATA:  Nonproductive cough and fever EXAM: CHEST - 2 VIEW COMPARISON:  None Available. FINDINGS: The heart size and mediastinal contours are within normal limits. Pulmonary infiltrate is seen in the left lower lobe, consistent with pneumonia. No pleural effusion. Right lung is clear. IMPRESSION: Left lower lobe infiltrate, consistent with pneumonia. Recommend continued chest radiographic follow-up to confirm resolution. Electronically Signed   By: Norleen DELENA Kil M.D.   On: 12/13/2023 09:44     UC Course/Treatments:  Procedures: Procedures   Medications Ordered in UC: Medications  ibuprofen  (ADVIL ) tablet 400 mg (has no administration in time range)  albuterol  (PROVENTIL ) (2.5 MG/3ML) 0.083% nebulizer solution 2.5 mg (has no administration in time range)     Assessment  and Plan :     ICD-10-CM   1. Hypoxia  R09.02     2. Fever, unspecified  R50.9 DG Chest 2 View    DG Chest 2 View    3. Influenza A  J10.1     4. Tachycardia  R00.0     5. Tachypnea  R06.82     6. Pneumonia of left lower lobe due to infectious organism  J18.9      Hypoxia Nontoxic-appearing, NAD. VSS. Secondary to flu and pneumonia.  Patient arrived acutely ill with oxygen saturations in the 90-91% and pulse at 125. Breathing treatment was administered and oxygen improved to 95%; however, it quickly dropped back down to 90-91%. Pulse remained at 125s. After reviewing flu results and imaging, I recommend patient go directly to  the ER given unstable vitals and concern for decompensation. He was placed on 3L of oxygen and stated he was going to call his wife. When I went back to discharge the patient, he stated his wife told him he did not need to go because he was not at 89% oxygen. I reiterated the concern for decompensation and his unstable vitals. I strongly encouraged him to go to the ER, but her refused. I at that point explained to him that he was going against medical advice and was assuming the risks involved in doing so including death and disability. Patient verbalized understanding at that time and stated he would still like to go home.  Influenza A Nontoxic-appearing, NAD. VSS. DDX includes but not limited to: COVID, flu, bronchitis, pneumonia, viral URI Flu was positive today in office. Tamiflu  75mg  BID was prescribed. CXR concern for pneumonia on the left. Will cover empirically for developing bacterial infection. Doxycycline  100mg  BID was prescribed. Recommend OTC analgesics as needed for pain. Strict ED precautions were given and patient verbalized understanding.  Pneumonia of left lower lobe due to infectious organism  Nontoxic-appearing, NAD. VSS. DDX includes but not limited to: COVID, flu, bronchitis, pneumonia, viral URI Flu was positive today in office. CXR concern  for pneumonia on the left. Suspect viral pneumonia. Will cover empirically for developing bacterial infection. Doxycycline  100mg  BID was prescribed.  He was prescribed Albuterol  inhaler 2 puffs every 4 hours PRN for shortness of breath or wheezing as well as Prednisone  40mg  every day. Strict ED precautions were given and patient verbalized understanding.  Fever, unspecified Nontoxic-appearing, NAD. VSS. DDX includes but not limited to: COVID, flu, bronchitis, pneumonia, viral URI Flu was positive today in office as well as pneumonia. Ibuprofen  400mg  PO was given in office for his fever. Recommend OTC analgesics as needed for pain. Strict ED precautions were given and patient verbalized understanding.  Tachycardia  Nontoxic-appearing, NAD. VSS. Secondary to flu and pneumonia.  Patient arrived acutely ill with oxygen saturations in the 90-91% and pulse at 125. Breathing treatment was administered and oxygen improved to 95%; however, it quickly dropped back down to 90-91%. Pulse remained at 125s. After reviewing flu results and imaging, I recommend patient go directly to the ER given unstable vitals and concern for decompensation. He was placed on 3L of oxygen and stated he was going to call his wife. When I went back to discharge the patient, he stated his wife told him he did not need to go because he was not at 89% oxygen. I reiterated the concern for decompensation and his unstable vitals. I strongly encouraged him to go to the ER, but her refused. I at that point explained to him that he was going against medical advice and was assuming the risks involved in doing so including death and disability. Patient verbalized understanding at that time and stated he would still like to go home.  Tachypnea Nontoxic-appearing, NAD. VSS. Secondary to flu and pneumonia.  Patient arrived acutely ill with oxygen saturations in the 90-91% and pulse at 125. Breathing treatment was administered and oxygen improved to  95%; however, it quickly dropped back down to 90-91%. Pulse remained at 125s. After reviewing flu results and imaging, I recommend patient go directly to the ER given unstable vitals and concern for decompensation. He was placed on 3L of oxygen and stated he was going to call his wife. When I went back to discharge the patient, he stated his wife told him he did not need  to go because he was not at 89% oxygen. I reiterated the concern for decompensation and his unstable vitals. I strongly encouraged him to go to the ER, but her refused. I at that point explained to him that he was going against medical advice and was assuming the risks involved in doing so including death and disability. Patient verbalized understanding at that time and stated he would still like to go home.  ED Discharge Orders          Ordered    albuterol  (VENTOLIN  HFA) 108 (90 Base) MCG/ACT inhaler  Every 4 hours PRN        12/13/23 0948    doxycycline  (VIBRAMYCIN ) 100 MG capsule  2 times daily        12/13/23 0948    oseltamivir  (TAMIFLU ) 75 MG capsule  Every 12 hours        12/13/23 0948    predniSONE  (DELTASONE ) 20 MG tablet  Daily with breakfast        12/13/23 0948             I have reviewed the PDMP during this encounter.     Johnathan Lee P, PA-C 12/13/23 1102

## 2023-12-13 NOTE — Discharge Instructions (Addendum)
Given your evaluation today and risk factors, I recommend you go directly to the ER for further imaging and evaluation.  Please noted our office is limited in the tests and imaging we can perform at our facility. Your evaluation was indicates a higher level of care is needed at this time.   If you refused to go, please understand the risk involved you are taking. These risks include, but are not limited to death and permanent disability. I have send several medicines to your pharmacy to help with your symptoms in the mean time.

## 2023-12-13 NOTE — ED Triage Notes (Signed)
Patient C/O non productive cough, sore throat, shortness of breath, fever and body aches x 3 days. Patient has not taken any antipyretics since last night.

## 2023-12-13 NOTE — ED Notes (Signed)
 Patient placed on 3 L O2 and O2 sat improved to 93%. Patient became tachypneic RR 32 with ambulation from X-ray. Patient will go to the emergency department for further evaluation. Ashlee PA at bedside discuss with patient. Patient wants to drive himself to the hospital.

## 2024-01-25 ENCOUNTER — Other Ambulatory Visit: Payer: Self-pay | Admitting: Physician Assistant

## 2024-01-25 ENCOUNTER — Ambulatory Visit
Admission: RE | Admit: 2024-01-25 | Discharge: 2024-01-25 | Disposition: A | Discharge status: Home / Self Care | Source: Ambulatory Visit | Attending: Physician Assistant | Admitting: Physician Assistant

## 2024-01-25 DIAGNOSIS — Z8701 Personal history of pneumonia (recurrent): Secondary | ICD-10-CM

## 2024-03-26 ENCOUNTER — Other Ambulatory Visit: Payer: Self-pay | Admitting: Gastroenterology

## 2024-05-15 ENCOUNTER — Encounter (HOSPITAL_COMMUNITY): Payer: Self-pay | Admitting: Gastroenterology

## 2024-05-15 NOTE — Progress Notes (Signed)
 Attempted to obtain medical history for pre op call via telephone, unable to reach at this time, primary # listed said not in service. HIPAA compliant voicemail message left requesting return call to pre surgical testing department, on wifes voicemail.

## 2024-05-22 ENCOUNTER — Ambulatory Visit (HOSPITAL_COMMUNITY): Admitting: Certified Registered Nurse Anesthetist

## 2024-05-22 ENCOUNTER — Other Ambulatory Visit: Payer: Self-pay

## 2024-05-22 ENCOUNTER — Ambulatory Visit (HOSPITAL_COMMUNITY)
Admission: RE | Admit: 2024-05-22 | Discharge: 2024-05-22 | Disposition: A | Discharge status: Home / Self Care | Attending: Gastroenterology | Admitting: Gastroenterology

## 2024-05-22 ENCOUNTER — Encounter (HOSPITAL_COMMUNITY): Payer: Self-pay | Admitting: Gastroenterology

## 2024-05-22 ENCOUNTER — Encounter (HOSPITAL_COMMUNITY)
Admission: RE | Disposition: A | Discharge status: Home / Self Care | Payer: Self-pay | Source: Home / Self Care | Attending: Gastroenterology

## 2024-05-22 DIAGNOSIS — E66813 Obesity, class 3: Secondary | ICD-10-CM | POA: Insufficient documentation

## 2024-05-22 DIAGNOSIS — Z1211 Encounter for screening for malignant neoplasm of colon: Secondary | ICD-10-CM | POA: Insufficient documentation

## 2024-05-22 DIAGNOSIS — K648 Other hemorrhoids: Secondary | ICD-10-CM | POA: Insufficient documentation

## 2024-05-22 DIAGNOSIS — M1990 Unspecified osteoarthritis, unspecified site: Secondary | ICD-10-CM | POA: Insufficient documentation

## 2024-05-22 DIAGNOSIS — D123 Benign neoplasm of transverse colon: Secondary | ICD-10-CM | POA: Diagnosis not present

## 2024-05-22 DIAGNOSIS — Z79899 Other long term (current) drug therapy: Secondary | ICD-10-CM | POA: Diagnosis not present

## 2024-05-22 DIAGNOSIS — R0602 Shortness of breath: Secondary | ICD-10-CM | POA: Insufficient documentation

## 2024-05-22 DIAGNOSIS — G4730 Sleep apnea, unspecified: Secondary | ICD-10-CM | POA: Insufficient documentation

## 2024-05-22 DIAGNOSIS — K644 Residual hemorrhoidal skin tags: Secondary | ICD-10-CM | POA: Diagnosis not present

## 2024-05-22 DIAGNOSIS — Z87891 Personal history of nicotine dependence: Secondary | ICD-10-CM | POA: Insufficient documentation

## 2024-05-22 DIAGNOSIS — Z6841 Body Mass Index (BMI) 40.0 and over, adult: Secondary | ICD-10-CM | POA: Diagnosis not present

## 2024-05-22 DIAGNOSIS — I10 Essential (primary) hypertension: Secondary | ICD-10-CM

## 2024-05-22 DIAGNOSIS — J45909 Unspecified asthma, uncomplicated: Secondary | ICD-10-CM | POA: Diagnosis not present

## 2024-05-22 HISTORY — PX: COLONOSCOPY: SHX5424

## 2024-05-22 SURGERY — COLONOSCOPY
Anesthesia: Monitor Anesthesia Care

## 2024-05-22 MED ORDER — PROPOFOL 500 MG/50ML IV EMUL
INTRAVENOUS | Status: DC | PRN
  Administered 2024-05-22: 170 ug/kg/min via INTRAVENOUS

## 2024-05-22 MED ORDER — SODIUM CHLORIDE 0.9 % IV SOLN
INTRAVENOUS | Status: AC | PRN
  Administered 2024-05-22: 500 mL via INTRAMUSCULAR

## 2024-05-22 MED ORDER — PROPOFOL 10 MG/ML IV BOLUS
INTRAVENOUS | Status: DC | PRN
  Administered 2024-05-22: 30 mg via INTRAVENOUS
  Administered 2024-05-22: 40 mg via INTRAVENOUS

## 2024-05-22 NOTE — Anesthesia Postprocedure Evaluation (Signed)
 Anesthesia Post Note  Patient: Johnathan Lee  Procedure(s) Performed: COLONOSCOPY     Patient location during evaluation: PACU Anesthesia Type: MAC Level of consciousness: awake and alert Pain management: pain level controlled Vital Signs Assessment: post-procedure vital signs reviewed and stable Respiratory status: spontaneous breathing, nonlabored ventilation and respiratory function stable Cardiovascular status: blood pressure returned to baseline and stable Postop Assessment: no apparent nausea or vomiting Anesthetic complications: no   No notable events documented.  Last Vitals:  Vitals:   05/22/24 0900 05/22/24 0909  BP: 123/85 130/81  Pulse: 77 (!) 38  Resp: (!) 22 13  Temp:    SpO2: 97% 96%    Last Pain:  Vitals:   05/22/24 0909  TempSrc:   PainSc: 0-No pain                 Butler Levander Pinal

## 2024-05-22 NOTE — Transfer of Care (Signed)
 Immediate Anesthesia Transfer of Care Note  Patient: Johnathan Lee  Procedure(s) Performed: COLONOSCOPY  Patient Location: PACU and Endoscopy Unit  Anesthesia Type:MAC  Level of Consciousness: drowsy and patient cooperative  Airway & Oxygen Therapy: Patient Spontanous Breathing and Patient connected to face mask oxygen  Post-op Assessment: Report given to RN and Post -op Vital signs reviewed and stable  Post vital signs: Reviewed and stable  Last Vitals:  Vitals Value Taken Time  BP    Temp    Pulse 89 05/22/24 08:48  Resp 19 05/22/24 08:48  SpO2 98 % 05/22/24 08:48  Vitals shown include unfiled device data.  Last Pain:  Vitals:   05/22/24 0713  TempSrc: Temporal  PainSc: 0-No pain         Complications: No notable events documented.

## 2024-05-22 NOTE — Anesthesia Preprocedure Evaluation (Signed)
 Anesthesia Evaluation  Patient identified by MRN, date of birth, ID band Patient awake    Reviewed: Allergy & Precautions, NPO status , Patient's Chart, lab work & pertinent test results  History of Anesthesia Complications Negative for: history of anesthetic complications  Airway Mallampati: IV  TM Distance: >3 FB Neck ROM: Full  Mouth opening: Limited Mouth Opening  Dental  (+) Dental Advisory Given, Poor Dentition   Pulmonary shortness of breath, asthma , sleep apnea , former smoker   breath sounds clear to auscultation       Cardiovascular hypertension, Pt. on medications (-) angina (-) Past MI  Rhythm:Regular     Neuro/Psych negative neurological ROS  negative psych ROS   GI/Hepatic negative GI ROS, Neg liver ROS,,,  Endo/Other    Class 3 obesity  Renal/GU negative Renal ROS     Musculoskeletal  (+) Arthritis ,    Abdominal  (+) + obese  Peds  Hematology negative hematology ROS (+)   Anesthesia Other Findings   Reproductive/Obstetrics                              Anesthesia Physical Anesthesia Plan  ASA: 3  Anesthesia Plan: MAC   Post-op Pain Management: Minimal or no pain anticipated   Induction: Intravenous  PONV Risk Score and Plan: 1 and Propofol  infusion and Treatment may vary due to age or medical condition  Airway Management Planned: Simple Face Mask  Additional Equipment: None  Intra-op Plan:   Post-operative Plan:   Informed Consent: I have reviewed the patients History and Physical, chart, labs and discussed the procedure including the risks, benefits and alternatives for the proposed anesthesia with the patient or authorized representative who has indicated his/her understanding and acceptance.     Dental advisory given  Plan Discussed with: CRNA  Anesthesia Plan Comments:          Anesthesia Quick Evaluation

## 2024-05-22 NOTE — Anesthesia Procedure Notes (Addendum)
 Procedure Name: MAC Date/Time: 05/22/2024 8:21 AM  Performed by: Brandy Almarie BROCKS, CRNAPre-anesthesia Checklist: Patient identified, Emergency Drugs available, Suction available and Patient being monitored Oxygen Delivery Method: Simple face mask Ventilation: Nasal airway inserted- appropriate to patient size Comments: 8.0 NPA R nare, atraumatic insertion.

## 2024-05-22 NOTE — Progress Notes (Signed)
 Bernardino Babe 8:10 AM  Subjective: Patient without any GI complaints and this is his first colonoscopy and his family history is negative  Objective: Vital signs stable afebrile no acute distress exam please see preassessment evaluation  Assessment: Colon screening  Plan: Okay to proceed with colonoscopy with anesthesia assistance  [facility]  office [phone] After 5PM or if no answer call [phone]

## 2024-05-22 NOTE — Op Note (Signed)
 [facility] Patient Name: Johnathan Lee Procedure Date: 05/22/2024 MRN: [id number] Attending MD: Oliva Boots , MD, [id number] Date of Birth: 05/07/1981 CSN: [id number] Age: 43 Admit Type: Outpatient Procedure:                Colonoscopy Indications:              Screening for colorectal malignant neoplasm, This                            is the patient's first colonoscopy Providers:                Oliva Boots, MD, Jacquelyn Jaci Pierce, RN,                            Jasmine Petiford, Technician, Delon Franchot BONINE Referring MD:              Medicines:                Monitored Anesthesia Care Complications:            No immediate complications. Estimated Blood Loss:     Estimated blood loss: none. Procedure:                Pre-Anesthesia Assessment:                           - Prior to the procedure, a History and Physical                            was performed, and patient medications and                            allergies were reviewed. The patient's tolerance of                            previous anesthesia was also reviewed. The risks                            and benefits of the procedure and the sedation                            options and risks were discussed with the patient.                            All questions were answered, and informed consent                            was obtained. Prior Anticoagulants: The patient has                            taken no anticoagulant or antiplatelet agents                            except for NSAID medication. ASA Grade Assessment:  III - A patient with severe systemic disease. After                            reviewing the risks and benefits, the patient was                            deemed in satisfactory condition to undergo the                            procedure.                           After obtaining informed consent, the colonoscope                            was  passed under direct vision. Throughout the                            procedure, the patient's blood pressure, pulse, and                            oxygen saturations were monitored continuously. The                            [id number]-HQ190L ([id number]) Olympus colonoscope was                            introduced through the anus and advanced to the the                            cecum, identified by appendiceal orifice and                            ileocecal valve. The ileocecal valve, appendiceal                            orifice, and rectum were photographed. The                            colonoscopy was performed without difficulty. The                            patient tolerated the procedure well. The quality                            of the bowel preparation was adequate to identify                            polyps greater than 5 mm in size better on the                            right than the left. Scope In: 8:27:31 AM Scope Out: 8:41:27 AM Scope Withdrawal Time: 0 hours 11 minutes 45 seconds  Total Procedure Duration: 0 hours  13 minutes 56 seconds  Findings:      External and internal hemorrhoids were found during retroflexion, during       perianal exam and during digital exam. The hemorrhoids were small.      A diminutive polyp was found in the distal transverse colon. The polyp       was semi-sessile. The polyp was removed with a cold snare. Resection and       retrieval were complete.      The exam was otherwise without abnormality on direct and retroflexion       views. Impression:               - External and internal hemorrhoids.                           - One diminutive polyp in the distal transverse                            colon, removed with a cold snare. Resected and                            retrieved.                           - The examination was otherwise normal on direct                            and retroflexion views. Moderate Sedation:      Not  Applicable - Patient had care per Anesthesia. Recommendation:           - Patient has a contact number available for                            emergencies. The signs and symptoms of potential                            delayed complications were discussed with the                            patient. Return to normal activities tomorrow.                            Written discharge instructions were provided to the                            patient.                           - Soft diet today.                           - Continue present medications.                           - Await pathology results.                           - Repeat colonoscopy in  5-10 years for surveillance                            based on pathology results.                           - Return to GI office PRN.                           - Telephone GI clinic for pathology results in 1                            week.                           - Telephone GI clinic if symptomatic PRN. Procedure Code(s):        --- Professional ---                           [phone], Colonoscopy, flexible; with removal of                            tumor(s), polyp(s), or other lesion(s) by snare                            technique Diagnosis Code(s):        --- Professional ---                           Z12.11, Encounter for screening for malignant                            neoplasm of colon                           D12.3, Benign neoplasm of transverse colon (hepatic                            flexure or splenic flexure) CPT copyright 2022 [employer]. All rights reserved. The codes documented in this report are preliminary and upon coder review may  be revised to meet current compliance requirements. Oliva Boots, MD 05/22/2024 8:53:15 AM This report has been signed electronically. Number of Addenda: 0

## 2024-05-22 NOTE — Discharge Instructions (Addendum)
 Soft solids like breakfast food first meal added here and call if GI question or problem otherwise call for biopsy report in 1 week and follow-up as needed and repeat colon screening in 5 to 10 years pending pathologyYOU HAD AN ENDOSCOPIC PROCEDURE TODAY: Refer to the procedure report and other information in the discharge instructions given to you for any specific questions about what was found during the examination. If this information does not answer your questions, please call [facility] office at [phone] to clarify.   YOU SHOULD EXPECT: Some feelings of bloating in the abdomen. Passage of more gas than usual. Walking can help get rid of the air that was put into your GI tract during the procedure and reduce the bloating. If you had a lower endoscopy (such as a colonoscopy or flexible sigmoidoscopy) you may notice spotting of blood in your stool or on the toilet paper. Some abdominal soreness may be present for a day or two, also.  DIET: Your first meal following the procedure should be a light meal and then it is ok to progress to your normal diet. A half-sandwich or bowl of soup is an example of a good first meal. Heavy or fried foods are harder to digest and may make you feel nauseous or bloated. Drink plenty of fluids but you should avoid alcoholic beverages for 24 hours. If you had a esophageal dilation, please see attached instructions for diet.    ACTIVITY: Your care partner should take you home directly after the procedure. You should plan to take it easy, moving slowly for the rest of the day. You can resume normal activity the day after the procedure however YOU SHOULD NOT DRIVE, use power tools, machinery or perform tasks that involve climbing or major physical exertion for 24 hours (because of the sedation medicines used during the test).   SYMPTOMS TO REPORT IMMEDIATELY: A gastroenterologist can be reached at any hour. Please call [phone]  for any of the following symptoms:   Following lower endoscopy (colonoscopy, flexible sigmoidoscopy) Excessive amounts of blood in the stool  Significant tenderness, worsening of abdominal pains  Swelling of the abdomen that is new, acute  Fever of 100 or higher    FOLLOW UP:  If any biopsies were taken you will be contacted by phone or by letter within the next 1-3 weeks. Call [phone]  if you have not heard about the biopsies in 3 weeks.  Please also call with any specific questions about appointments or follow up tests.

## 2024-05-23 LAB — SURGICAL PATHOLOGY

## 2024-05-24 ENCOUNTER — Encounter (HOSPITAL_COMMUNITY): Payer: Self-pay | Admitting: Gastroenterology

## 2024-08-01 ENCOUNTER — Other Ambulatory Visit: Payer: Self-pay | Admitting: General Surgery

## 2024-08-01 DIAGNOSIS — K603 Anal fistula, unspecified: Secondary | ICD-10-CM

## 2024-08-27 ENCOUNTER — Ambulatory Visit
Admission: RE | Admit: 2024-08-27 | Discharge: 2024-08-27 | Disposition: A | Discharge status: Home / Self Care | Source: Ambulatory Visit | Attending: General Surgery | Admitting: General Surgery

## 2024-08-27 DIAGNOSIS — K603 Anal fistula, unspecified: Secondary | ICD-10-CM

## 2024-08-27 MED ORDER — GADOPICLENOL 0.5 MMOL/ML IV SOLN
10.0000 mL | Freq: Once | INTRAVENOUS | Status: AC | PRN
  Administered 2024-08-27: 10 mL via INTRAVENOUS

## 2024-09-04 ENCOUNTER — Ambulatory Visit: Admitting: Podiatry

## 2024-09-04 ENCOUNTER — Encounter: Payer: Self-pay | Admitting: Podiatry

## 2024-09-04 DIAGNOSIS — L03032 Cellulitis of left toe: Secondary | ICD-10-CM | POA: Diagnosis not present

## 2024-09-04 DIAGNOSIS — L603 Nail dystrophy: Secondary | ICD-10-CM

## 2024-09-04 MED ORDER — CEPHALEXIN 500 MG PO CAPS: 500.0000 mg | ORAL_CAPSULE | Freq: Four times a day (QID) | ORAL | 0 refills | Status: DC

## 2024-09-04 NOTE — Progress Notes (Signed)
 Subjective:  Patient ID: Johnathan Lee, male    DOB: 1980-12-20,  MRN: [id number]  Chief Complaint  Patient presents with   Ingrown Toenail    Rm11 Ingrown nail bilateral hallux/ 1 wk sore and swollen to touch/not diabetic/no treatment    Discussed the use of AI scribe software for clinical note transcription with the patient, who gave verbal consent to proceed.  History of Present Illness Johnathan Lee is a 43 year old male who presents with a painful ingrown toenail on the left foot.  He has experienced pain from the ingrown toenail for the past couple of days, with symptoms starting on Sunday morning. The discomfort is significant, affecting his sleep as he keeps his foot outside the covers. The toenail appeared white yesterday, with no fluid discharge observed.  He has a history of ingrown toenails, but this episode is more severe. Typically, he clips the nail to manage the condition, but for this episode, he has only soaked the foot on Sunday.  His past medical history includes stable high blood pressure. He has no diabetes or medication allergies. His grandmother had similar toenail issues.  Only the left toenail is currently painful, with no pain on the right side.      Objective:  There were no vitals filed for this visit.  Physical Exam General: AAO x3, NAD  Dermatological: Incurvation present on multiple toenails.  The nails in general are hypertrophic, dystrophic with discoloration.  They are all about the same in color.  In the left hallux toenail on the medial nail border there is localized edema and erythema.  Able to visualize some purulence noted underneath the toenail corner.  There is no ascending cellulitis.  Vascular: Dorsalis Pedis artery and Posterior Tibial artery pedal pulses are 2/4 bilateral with immedate capillary fill time. There is no pain with calf compression, swelling, warmth, erythema.   Neruologic: Grossly intact via light touch bilateral.    Musculoskeletal: Tenderness to the ingrown toenail.  No other areas of discomfort.     No images are attached to the encounter.    Results    Assessment:   1. Paronychia of toenail of left foot      Plan:  Patient was evaluated and treated and all questions answered.  Assessment and Plan Assessment & Plan Acute left ingrown toenail with localized infection Significant pain and inflammation with possible pus formation. Infection precludes phenol use. - At this time, recommended partial nail removal, I&D without chemical matricectomy to the left due to infection. Risks and complications were discussed with the patient for which they understand and  verbally consent to the procedure. Under sterile conditions a total of 3 mL of a mixture of 2% lidocaine  plain and 0.5% Marcaine  plain was infiltrated in a hallux block fashion. Once anesthetized, the skin was prepped in sterile fashion. A tourniquet was then applied. Next the symptomatic border of the hallux nail border was sharply excised making sure to remove the entire offending nail border.  There was purulence noted and cultures obtained.  Once the nail was removed, the area was debrided and the underlying skin was intact. The area was irrigated and hemostasis was obtained.  A dry sterile dressing was applied. After application of the dressing the tourniquet was removed and there is found to be an immediate capillary refill time to the digit. The patient tolerated the procedure well any complications. Post procedure instructions were discussed the patient for which he verbally understood. Follow-up in one week for nail  check or sooner if any problems are to arise. Discussed signs/symptoms of worsening infection and directed to call the office immediately should any occur or go directly to the emergency room. In the meantime, encouraged to call the office with any questions, concerns, changes symptoms. -Nail sent for fungal  culture -Keflex    Return in about 2 weeks (around 09/18/2024) for nail infection.   Donnice JONELLE Fees DPM

## 2024-09-04 NOTE — Patient Instructions (Signed)

## 2024-09-04 NOTE — Addendum Note (Signed)
 Addended by: GIB MABLE SAUNDERS on: 09/04/2024 04:52 PM   Modules accepted: Orders

## 2024-09-07 LAB — WOUND CULTURE
MICRO NUMBER:: 17158532
SPECIMEN QUALITY:: ADEQUATE

## 2024-09-10 ENCOUNTER — Ambulatory Visit: Payer: Self-pay | Admitting: Podiatry

## 2024-09-10 ENCOUNTER — Ambulatory Visit: Payer: Self-pay | Admitting: General Surgery

## 2024-09-10 NOTE — H&P (Signed)
 PROVIDER:  Carlean Crowl CHRISTINE Azaleah Usman, MD  MRN: [id number] DOB: 25-Dec-1980 DATE OF ENCOUNTER: 09/10/2024 Subjective   Chief Complaint: Wound Check (Discuss results from recent MRI- anal fistula/)     History of Present Illness: Johnathan Lee is a 43 y.o. male who is seen today for anal fistula.  Patient is status post posterior midline fistulotomy September 2023.  He recently developed some recurrent pain and is here today for evaluation.  He also complains of some intermittent abdominal pain but denies any diarrhea, since starting Zepbound.  Past Medical History:  Diagnosis Date   Asthma    Former smoker    Hypertension    Obesity    Osteoarthritis    Sleep apnea     Past Surgical History:  Procedure Laterality Date   COLONOSCOPY N/A 05/22/2024   Procedure: COLONOSCOPY;  Surgeon: Rosalie Kitchens, MD;  Location: [facility];  Service: Gastroenterology;  Laterality: N/A;   FISTULOTOMY N/A 07/01/2022   Procedure: FISTULOTOMY;  Surgeon: Debby Hila, MD;  Location: [facility];  Service: General;  Laterality: N/A;   NO PAST SURGERIES     RECTAL EXAM UNDER ANESTHESIA N/A 07/01/2022   Procedure: RECTAL EXAM UNDER ANESTHESIA;  Surgeon: Debby Hila, MD;  Location: [facility];  Service: General;  Laterality: N/A;    No family history on file.  Social History   Socioeconomic History   Marital status: Married    Spouse name: Not on file   Number of children: Not on file   Years of education: Not on file   Highest education level: Not on file  Occupational History   Not on file  Tobacco Use   Smoking status: Former    Current packs/day: 0.00    Average packs/day: 0.5 packs/day for 20.0 years (10.0 ttl pk-yrs)    Types: Cigarettes, Cigars    Start date: 04/26/2002    Quit date: 04/26/2022    Years since quitting: 2.3   Smokeless tobacco: Never  Vaping Use   Vaping status: Never Used  Substance and Sexual Activity   Alcohol use: Not Currently   Drug use: Never   Sexual activity: Not  on file  Other Topics Concern   Not on file  Social History Narrative   Not on file   Social Drivers of Health   Financial Resource Strain: Low Risk  (03/01/2023)   Received from [facility]   Overall Financial Resource Strain (CARDIA)    Difficulty of Paying Living Expenses: Not very hard  Food Insecurity: No Food Insecurity (03/01/2023)   Received from [facility]   Hunger Vital Sign    Within the past 12 months, you worried that your food would run out before you got the money to buy more.: Never true    Within the past 12 months, the food you bought just didn't last and you didn't have money to get more.: Never true  Transportation Needs: No Transportation Needs (03/01/2023)   Received from [facility] - Transportation    Lack of Transportation (Medical): No    Lack of Transportation (Non-Medical): No  Physical Activity: Unknown (03/01/2023)   Received from [facility]   Exercise Vital Sign    On average, how many days per week do you engage in moderate to strenuous exercise (like a brisk walk)?: 0 days    Minutes of Exercise per Session: Not on file  Stress: Stress Concern Present (03/01/2023)   Received from [facility] - Occupational  Stress Questionnaire    Feeling of Stress : Rather much  Social Connections: Somewhat Isolated (03/01/2023)   Received from [facility]   Social Network    How would you rate your social network (family, work, friends)?: Restricted participation with some degree of social isolation  Intimate Partner Violence: Not At Risk (03/01/2023)   Received from [facility]   HITS    Over the last 12 months how often did your partner physically hurt you?: Never    Over the last 12 months how often did your partner insult you or talk down to you?: Never    Over the last 12 months how often did your partner threaten you with physical harm?: Never    Over the last 12 months how often did your  partner scream or curse at you?: Never     Current Outpatient Medications:    albuterol  (VENTOLIN  HFA) 108 (90 Base) MCG/ACT inhaler, Inhale 2 puffs into the lungs every 4 (four) hours as needed for wheezing or shortness of breath., Disp: 18 g, Rfl: 0   amLODipine  (NORVASC ) 5 MG tablet, Take 5 mg by mouth daily., Disp: , Rfl:    buPROPion (WELLBUTRIN SR) 150 MG 12 hr tablet, Take 150 mg by mouth 2 (two) times daily., Disp: , Rfl:    carvedilol (COREG) 6.25 MG tablet, Take 6.25 mg by mouth 2 (two) times daily with a meal., Disp: , Rfl:    cephALEXin (KEFLEX) 500 MG capsule, Take 1 capsule (500 mg total) by mouth 4 (four) times daily., Disp: 28 capsule, Rfl: 0   chlorthalidone (HYGROTON) 25 MG tablet, Take 25 mg by mouth every morning., Disp: , Rfl:    hydrochlorothiazide  (HYDRODIURIL ) 25 MG tablet, TAKE 1 TABLET BY MOUTH ONCE A DAY IN THE MORNING (Patient not taking: Reported on 09/20/2022), Disp: 30 tablet, Rfl: 1   ibuprofen  (ADVIL ) 200 MG tablet, Take 200 mg by mouth every 6 (six) hours as needed for mild pain (pain)., Disp: , Rfl:    ibuprofen  (ADVIL ) 400 MG tablet, Take 1 tablet (400 mg total) by mouth every 4 (four) hours as needed for mild pain. (Patient not taking: Reported on 06/21/2022), Disp: 30 tablet, Rfl: 0   losartan (COZAAR) 50 MG tablet, Take 50 mg by mouth daily., Disp: , Rfl:   No Known Allergies  Review of Systems - Negative except as stated above     Objective:   There were no vitals filed for this visit. There is no height or weight on file to calculate BMI.   Gen: NAD Rectal: Punctate lesion posterior.  Fistula probe inserts approximately 5 mm.  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  He has a history of a previous fistula with fistulotomy.  MRI was obtained.  This showed a posterior fistula at his previous fistulotomy site.  I recommended repeat fistulotomy.  We discussed using a  fiber supplement afterwards to keep the area open while it heals.     Bernarda JAYSON Ned, MD Colon and Rectal Surgery [facility]

## 2024-09-18 ENCOUNTER — Encounter: Payer: Self-pay | Admitting: Podiatry

## 2024-09-18 ENCOUNTER — Ambulatory Visit: Admitting: Podiatry

## 2024-09-18 DIAGNOSIS — L03032 Cellulitis of left toe: Secondary | ICD-10-CM

## 2024-09-18 DIAGNOSIS — L603 Nail dystrophy: Secondary | ICD-10-CM | POA: Diagnosis not present

## 2024-09-18 NOTE — Patient Instructions (Signed)
 You can use UREA NAIL GEL on the thicker toenail once the procedure site has completely healed.   If you notice any increased pain, swelling, redness, drainage or signs of infection please let me know.

## 2024-09-18 NOTE — Progress Notes (Signed)
 Subjective: Chief Complaint  Patient presents with   Ingrown Toenail    Rm13 Nail check left great toe/ pt says he has some tenderness with toe/itching   Wounds are-year-old male presents the office today with above concerns.  He states that he he is feeling much better than he was patient has occasional tenderness to the toe but overall improving.  He keeps the area wrapped during the day but leave the area open at home.  Has not seen any drainage or pus.  Objective: AAO x3, NAD DP/PT pulses palpable bilaterally, CRT less than 3 seconds Post partial avulsion which is healing well.  Small scabbing is present but there is no open wound.  There is no significant edema is no erythema but there is no drainage or pus.  No fluctuation crepitation.  No signs of an abscess today. No pain with calf compression, swelling, warmth, erythema  Assessment: Status post partial nail avulsion, paronychia  Plan: -All treatment options discussed with the patient including all alternatives, risks, complications.  -Reviewed wound culture as well as no culture results.  Discussed urea nail gel as the toenail completely heals. -Continue the soaking and keeping it covered till the scab is completely come off.  Discussed monitor for any signs or symptoms of reoccurrence of infection or ingrown toenail pain.  Should symptoms recur or worsen discussed partial nail avulsion with chemical matricectomy if no infection. -Patient encouraged to call the office with any questions, concerns, change in symptoms.   Donnice JONELLE Fees DPM

## 2024-10-02 NOTE — Progress Notes (Addendum)
 Anesthesia Review:  PCP: Margarete at [facility]  Cardiologist : none   PPM/ ICD: Device Orders: Rep Notified:  Chest x-ray : 02/01/24- 2 view  EKG : 10/09/24 - Harlene Ward,PAc aware. On 10/09/2024.  Echo : Stress test: Cardiac Cath :   Activity level: can do a flight of stairs without difficulty  Sleep Study/ CPAP : hasm cpap  Fasting Blood Sugar :      / Checks Blood Sugar -- times a day:     Zepbound-last dose on 10/07/24   Blood Thinner/ Instructions /Last Dose: ASA / Instructions/ Last Dose :    05/22/24- colonoscopy    Pt wanted to use own pen for preop appt signature.    BMP hemolyzed at preop appt.  Will be redrawn DOS.

## 2024-10-08 NOTE — Patient Instructions (Signed)
 SURGICAL WAITING ROOM VISITATION  Patients having surgery or a procedure may have no more than 2 support people in the waiting area - these visitors may rotate.    Children under the age of 71 must have an adult with them who is not the patient.  Visitors with respiratory illnesses are discouraged from visiting and should remain at home.  If the patient needs to stay at the hospital during part of their recovery, the visitor guidelines for inpatient rooms apply. Pre-op nurse will coordinate an appropriate time for 1 support person to accompany patient in pre-op.  This support person may not rotate.    Please refer to the [facility] website for the visitor guidelines for Inpatients (after your surgery is over and you are in a regular room).       Your procedure is scheduled on:  10/18/2024    Report to [facility] Main Entrance    Report to admitting at   0515am    Call this number if you have problems the morning of surgery [phone]   Do not eat food or drink liquids  :After Midnight.                               If you have questions, please contact your surgeon's office.       Oral Hygiene is also important to reduce your risk of infection.                                    Remember - BRUSH YOUR TEETH THE MORNING OF SURGERY WITH YOUR REGULAR TOOTHPASTE  DENTURES WILL BE REMOVED PRIOR TO SURGERY PLEASE DO NOT APPLY Poly grip OR ADHESIVES!!!   Do NOT smoke after Midnight   Stop all vitamins and herbal supplements 7 days before surgery.   Take these medicines the morning of surgery with A SIP OF WATER:  coreg  Zepbound- last dose on   DO NOT TAKE ANY ORAL DIABETIC MEDICATIONS DAY OF YOUR SURGERY  Bring CPAP mask and tubing day of surgery.                              You may not have any metal on your body including hair pins, jewelry, and body piercing             Do not wear make-up, lotions, powders, perfumes/cologne, or deodorant  Do not  wear nail polish including gel and S&S, artificial/acrylic nails, or any other type of covering on natural nails including finger and toenails. If you have artificial nails, gel coating, etc. that needs to be removed by a nail salon please have this removed prior to surgery or surgery may need to be canceled/ delayed if the surgeon/ anesthesia feels like they are unable to be safely monitored.   Do not shave  48 hours prior to surgery.               Men may shave face and neck.   Do not bring valuables to the hospital. [facility] IS NOT             RESPONSIBLE   FOR VALUABLES.   Contacts, glasses, dentures or bridgework may not be worn into surgery.   Bring small overnight bag day of surgery.   DO NOT  BRING YOUR HOME MEDICATIONS TO THE HOSPITAL. PHARMACY WILL DISPENSE MEDICATIONS LISTED ON YOUR MEDICATION LIST TO YOU DURING YOUR ADMISSION IN THE HOSPITAL!    Patients discharged on the day of surgery will not be allowed to drive home.  Someone NEEDS to stay with you for the first 24 hours after anesthesia.   Special Instructions: Bring a copy of your healthcare power of attorney and living will documents the day of surgery if you haven't scanned them before.              Please read over the following fact sheets you were given: IF YOU HAVE QUESTIONS ABOUT YOUR PRE-OP INSTRUCTIONS PLEASE CALL [phone].   If you received a COVID test during your pre-op visit  it is requested that you wear a mask when out in public, stay away from anyone that may not be feeling well and notify your surgeon if you develop symptoms. If you test positive for Covid or have been in contact with anyone that has tested positive in the last 10 days please notify you surgeon.    [facility] - Preparing for Surgery Before surgery, you can play an important role.  Because skin is not sterile, your skin needs to be as free of germs as possible.  You can reduce the number of germs on your skin by washing with CHG  (chlorahexidine gluconate) soap before surgery.  CHG is an antiseptic cleaner which kills germs and bonds with the skin to continue killing germs even after washing. Please DO NOT use if you have an allergy to CHG or antibacterial soaps.  If your skin becomes reddened/irritated stop using the CHG and inform your nurse when you arrive at Short Stay. Do not shave (including legs and underarms) for at least 48 hours prior to the first CHG shower.  You may shave your face/neck.  Please follow these instructions carefully:  1.  Shower with CHG Soap the night before surgery ONLY (DO NOT USE THE SOAP THE MORNING OF SURGERY).  2.  If you choose to wash your hair, wash your hair first as usual with your normal  shampoo.  3.  After you shampoo, rinse your hair and body thoroughly to remove the shampoo.                             4.  Use CHG as you would any other liquid soap.  You can apply chg directly to the skin and wash.  Gently with a scrungie or clean washcloth.  5.  Apply the CHG Soap to your body ONLY FROM THE NECK DOWN.   Do   not use on face/ open                           Wound or open sores. Avoid contact with eyes, ears mouth and   genitals (private parts).                       Wash face,  Genitals (private parts) with your normal soap.             6.  Wash thoroughly, paying special attention to the area where your    surgery  will be performed.  7.  Thoroughly rinse your body with warm water from the neck down.  8.  DO NOT shower/wash with your normal soap after using and rinsing  off the CHG Soap.                9.  Pat yourself dry with a clean towel.            10.  Wear clean pajamas.            11.  Place clean sheets on your bed the night of your first shower and do not  sleep with pets. Day of Surgery : Do not apply any CHG, lotions/deodorants the morning of surgery.  Please wear clean clothes to the hospital/surgery center.  FAILURE TO FOLLOW THESE INSTRUCTIONS MAY RESULT IN THE  CANCELLATION OF YOUR SURGERY  PATIENT SIGNATURE_________________________________  NURSE SIGNATURE__________________________________  ________________________________________________________________________

## 2024-10-09 ENCOUNTER — Encounter (HOSPITAL_COMMUNITY): Payer: Self-pay

## 2024-10-09 ENCOUNTER — Other Ambulatory Visit: Payer: Self-pay

## 2024-10-09 ENCOUNTER — Encounter (HOSPITAL_COMMUNITY)
Admission: RE | Admit: 2024-10-09 | Discharge: 2024-10-09 | Disposition: A | Source: Ambulatory Visit | Attending: General Surgery

## 2024-10-09 VITALS — BP 149/95 | HR 81 | Temp 98.5°F | Resp 16 | Ht 65.0 in | Wt 285.0 lb

## 2024-10-09 DIAGNOSIS — Z01818 Encounter for other preprocedural examination: Secondary | ICD-10-CM | POA: Insufficient documentation

## 2024-10-09 DIAGNOSIS — R9431 Abnormal electrocardiogram [ECG] [EKG]: Secondary | ICD-10-CM | POA: Insufficient documentation

## 2024-10-09 HISTORY — DX: Pneumonia, unspecified organism: J18.9

## 2024-10-09 HISTORY — DX: Cardiac murmur, unspecified: R01.1

## 2024-10-09 LAB — CBC
HCT: 41.9 % (ref 39.0–52.0)
Hemoglobin: 13.6 g/dL (ref 13.0–17.0)
MCH: 30.4 pg (ref 26.0–34.0)
MCHC: 32.5 g/dL (ref 30.0–36.0)
MCV: 93.7 fL (ref 80.0–100.0)
Platelets: 338 K/uL (ref 150–400)
RBC: 4.47 MIL/uL (ref 4.22–5.81)
RDW: 13.5 % (ref 11.5–15.5)
WBC: 6.2 K/uL (ref 4.0–10.5)
nRBC: 0 % (ref 0.0–0.2)

## 2024-10-18 ENCOUNTER — Encounter (HOSPITAL_COMMUNITY): Admission: RE | Disposition: A | Payer: Self-pay | Source: Ambulatory Visit | Attending: General Surgery

## 2024-10-18 ENCOUNTER — Encounter (HOSPITAL_COMMUNITY): Payer: Self-pay | Admitting: Medical

## 2024-10-18 ENCOUNTER — Encounter (HOSPITAL_COMMUNITY): Payer: Self-pay | Admitting: General Surgery

## 2024-10-18 ENCOUNTER — Ambulatory Visit (HOSPITAL_COMMUNITY): Admitting: Anesthesiology

## 2024-10-18 ENCOUNTER — Ambulatory Visit (HOSPITAL_COMMUNITY)
Admission: RE | Admit: 2024-10-18 | Discharge: 2024-10-18 | Disposition: A | Attending: General Surgery | Admitting: General Surgery

## 2024-10-18 DIAGNOSIS — Z01818 Encounter for other preprocedural examination: Secondary | ICD-10-CM

## 2024-10-18 DIAGNOSIS — K60313 Anal fistula, simple, recurrent: Secondary | ICD-10-CM | POA: Diagnosis not present

## 2024-10-18 DIAGNOSIS — I10 Essential (primary) hypertension: Secondary | ICD-10-CM

## 2024-10-18 DIAGNOSIS — G4730 Sleep apnea, unspecified: Secondary | ICD-10-CM | POA: Diagnosis not present

## 2024-10-18 DIAGNOSIS — Z87891 Personal history of nicotine dependence: Secondary | ICD-10-CM

## 2024-10-18 HISTORY — PX: ANAL FISTULOTOMY: SHX6423

## 2024-10-18 LAB — BASIC METABOLIC PANEL WITH GFR
Anion gap: 10 (ref 5–15)
BUN: 9 mg/dL (ref 6–20)
CO2: 23 mmol/L (ref 22–32)
Calcium: 9.2 mg/dL (ref 8.9–10.3)
Chloride: 104 mmol/L (ref 98–111)
Creatinine, Ser: 0.81 mg/dL (ref 0.61–1.24)
GFR, Estimated: >60 mL/min (ref >60)
Glucose, Bld: 99 mg/dL (ref 70–99)
Potassium: 4.8 mmol/L (ref 3.5–5.1)
Sodium: 137 mmol/L (ref 135–145)

## 2024-10-18 LAB — GLUCOSE, CAPILLARY: Glucose-Capillary: 109 mg/dL — ABNORMAL HIGH (ref 70–99)

## 2024-10-18 SURGERY — ANAL FISTULOTOMY
Anesthesia: General

## 2024-10-18 MED ORDER — MIDAZOLAM HCL (PF) 2 MG/2ML IJ SOLN
INTRAMUSCULAR | Status: DC | PRN
  Administered 2024-10-18: 2 mg via INTRAVENOUS

## 2024-10-18 MED ORDER — AMISULPRIDE (ANTIEMETIC) 5 MG/2ML IV SOLN: 10.0000 mg | Freq: Once | INTRAVENOUS | Status: DC | PRN

## 2024-10-18 MED ORDER — SUCCINYLCHOLINE CHLORIDE 200 MG/10ML IV SOSY
PREFILLED_SYRINGE | INTRAVENOUS | Status: DC | PRN
  Administered 2024-10-18: 200 mg via INTRAVENOUS

## 2024-10-18 MED ORDER — BUPIVACAINE-EPINEPHRINE (PF) 0.25% -1:200000 IJ SOLN
INTRAMUSCULAR | Status: AC
  Filled 2024-10-18: qty 30

## 2024-10-18 MED ORDER — PROPOFOL 10 MG/ML IV BOLUS
INTRAVENOUS | Status: AC
  Filled 2024-10-18: qty 20

## 2024-10-18 MED ORDER — FENTANYL CITRATE (PF) 100 MCG/2ML IJ SOLN
INTRAMUSCULAR | Status: AC
  Filled 2024-10-18: qty 2

## 2024-10-18 MED ORDER — PHENYLEPHRINE 80 MCG/ML (10ML) SYRINGE FOR IV PUSH (FOR BLOOD PRESSURE SUPPORT)
PREFILLED_SYRINGE | INTRAVENOUS | Status: DC | PRN
  Administered 2024-10-18: 80 ug via INTRAVENOUS

## 2024-10-18 MED ORDER — 0.9 % SODIUM CHLORIDE (POUR BTL) OPTIME
TOPICAL | Status: DC | PRN
  Administered 2024-10-18: 1000 mL

## 2024-10-18 MED ORDER — DEXAMETHASONE SOD PHOSPHATE PF 10 MG/ML IJ SOLN
INTRAMUSCULAR | Status: DC | PRN
  Administered 2024-10-18: 10 mg via INTRAVENOUS

## 2024-10-18 MED ORDER — PHENYLEPHRINE 80 MCG/ML (10ML) SYRINGE FOR IV PUSH (FOR BLOOD PRESSURE SUPPORT)
PREFILLED_SYRINGE | INTRAVENOUS | Status: AC
  Filled 2024-10-18: qty 10

## 2024-10-18 MED ORDER — ONDANSETRON HCL 4 MG/2ML IJ SOLN
INTRAMUSCULAR | Status: DC | PRN
  Administered 2024-10-18: 4 mg via INTRAVENOUS

## 2024-10-18 MED ORDER — MEPERIDINE HCL 25 MG/ML IJ SOLN: 6.2500 mg | INTRAMUSCULAR | Status: DC | PRN

## 2024-10-18 MED ORDER — ROCURONIUM BROMIDE 10 MG/ML (PF) SYRINGE
PREFILLED_SYRINGE | INTRAVENOUS | Status: AC
  Filled 2024-10-18: qty 10

## 2024-10-18 MED ORDER — TRAMADOL HCL 50 MG PO TABS: 50.0000 mg | ORAL_TABLET | Freq: Four times a day (QID) | ORAL | 0 refills | Status: AC | PRN

## 2024-10-18 MED ORDER — LIDOCAINE HCL (PF) 2 % IJ SOLN
INTRAMUSCULAR | Status: DC | PRN
  Administered 2024-10-18: 100 mg via INTRADERMAL

## 2024-10-18 MED ORDER — MIDAZOLAM HCL 2 MG/2ML IJ SOLN
INTRAMUSCULAR | Status: AC
  Filled 2024-10-18: qty 2

## 2024-10-18 MED ORDER — LIDOCAINE HCL (PF) 2 % IJ SOLN
INTRAMUSCULAR | Status: AC
  Filled 2024-10-18: qty 5

## 2024-10-18 MED ORDER — CHLORHEXIDINE GLUCONATE 0.12 % MT SOLN
15.0000 mL | Freq: Once | OROMUCOSAL | Status: AC
  Administered 2024-10-18: 15 mL via OROMUCOSAL

## 2024-10-18 MED ORDER — ORAL CARE MOUTH RINSE: 15.0000 mL | Freq: Once | OROMUCOSAL | Status: AC

## 2024-10-18 MED ORDER — SODIUM CHLORIDE 0.9% FLUSH: 3.0000 mL | Freq: Two times a day (BID) | INTRAVENOUS | Status: DC

## 2024-10-18 MED ORDER — OXYCODONE HCL 5 MG/5ML PO SOLN: 5.0000 mg | Freq: Once | ORAL | Status: AC | PRN

## 2024-10-18 MED ORDER — ROCURONIUM BROMIDE 10 MG/ML (PF) SYRINGE
PREFILLED_SYRINGE | INTRAVENOUS | Status: DC | PRN
  Administered 2024-10-18: 5 mg via INTRAVENOUS

## 2024-10-18 MED ORDER — ONDANSETRON HCL 4 MG/2ML IJ SOLN
INTRAMUSCULAR | Status: AC
  Filled 2024-10-18: qty 2

## 2024-10-18 MED ORDER — KETOROLAC TROMETHAMINE 30 MG/ML IJ SOLN
INTRAMUSCULAR | Status: DC | PRN
  Administered 2024-10-18: 30 mg via INTRAVENOUS

## 2024-10-18 MED ORDER — GLYCOPYRROLATE 0.2 MG/ML IJ SOLN
INTRAMUSCULAR | Status: DC | PRN
  Administered 2024-10-18: .2 mg via INTRAVENOUS

## 2024-10-18 MED ORDER — LACTATED RINGERS IV SOLN: INTRAVENOUS | Status: DC | PRN

## 2024-10-18 MED ORDER — HYDROMORPHONE HCL 1 MG/ML IJ SOLN: 0.2500 mg | INTRAMUSCULAR | Status: DC | PRN

## 2024-10-18 MED ORDER — PROPOFOL 10 MG/ML IV BOLUS
INTRAVENOUS | Status: DC | PRN
  Administered 2024-10-18: 50 mg via INTRAVENOUS
  Administered 2024-10-18: 200 mg via INTRAVENOUS

## 2024-10-18 MED ORDER — LIDOCAINE HCL URETHRAL/MUCOSAL 2 % EX GEL
CUTANEOUS | Status: AC
  Filled 2024-10-18: qty 30

## 2024-10-18 MED ORDER — KETOROLAC TROMETHAMINE 30 MG/ML IJ SOLN
INTRAMUSCULAR | Status: AC
  Filled 2024-10-18: qty 1

## 2024-10-18 MED ORDER — LACTATED RINGERS IV SOLN: INTRAVENOUS | Status: DC

## 2024-10-18 MED ORDER — HYDROGEN PEROXIDE 3 % EX SOLN
CUTANEOUS | Status: AC
  Filled 2024-10-18: qty 473

## 2024-10-18 MED ORDER — BUPIVACAINE-EPINEPHRINE 0.25% -1:200000 IJ SOLN
INTRAMUSCULAR | Status: DC | PRN
  Administered 2024-10-18: 15 mL

## 2024-10-18 MED ORDER — OXYCODONE HCL 5 MG PO TABS
5.0000 mg | ORAL_TABLET | Freq: Once | ORAL | Status: AC | PRN
  Administered 2024-10-18: 5 mg via ORAL

## 2024-10-18 MED ORDER — OXYCODONE HCL 5 MG PO TABS
ORAL_TABLET | ORAL | Status: AC
  Filled 2024-10-18: qty 1

## 2024-10-18 MED ORDER — ALBUTEROL SULFATE HFA 108 (90 BASE) MCG/ACT IN AERS
INHALATION_SPRAY | RESPIRATORY_TRACT | Status: DC | PRN
  Administered 2024-10-18: 2 via RESPIRATORY_TRACT

## 2024-10-18 SURGICAL SUPPLY — 31 items
BAG COUNTER SPONGE SURGICOUNT (BAG) IMPLANT
BLADE SURG 15 STRL LF DISP TIS (BLADE) IMPLANT
BRIEF MESH DISP LRG (UNDERPADS AND DIAPERS) ×1 IMPLANT
DRAPE LAPAROTOMY T 102X78X121 (DRAPES) IMPLANT
DRAPE UTILITY XL STRL (DRAPES) IMPLANT
ELECT REM PT RETURN 15FT ADLT (MISCELLANEOUS) ×1 IMPLANT
GAUZE 4X4 16PLY ~~LOC~~+RFID DBL (SPONGE) ×1 IMPLANT
GAUZE PAD ABD 8X10 STRL (GAUZE/BANDAGES/DRESSINGS) IMPLANT
GAUZE SPONGE 4X4 12PLY STRL (GAUZE/BANDAGES/DRESSINGS) IMPLANT
GLOVE BIO SURGEON STRL SZ 6.5 (GLOVE) ×1 IMPLANT
GLOVE INDICATOR 6.5 STRL GRN (GLOVE) ×2 IMPLANT
GOWN STRL REUS W/ TWL XL LVL3 (GOWN DISPOSABLE) ×2 IMPLANT
IV CATH 14GX2 1/4 (CATHETERS) IMPLANT
KIT BASIN OR (CUSTOM PROCEDURE TRAY) ×1 IMPLANT
KIT TURNOVER KIT A (KITS) ×1 IMPLANT
NDL HYPO 22X1.5 SAFETY MO (MISCELLANEOUS) ×1 IMPLANT
PACK BASIC VI WITH GOWN DISP (CUSTOM PROCEDURE TRAY) IMPLANT
PACK LITHOTOMY IV (CUSTOM PROCEDURE TRAY) IMPLANT
PENCIL SMOKE EVACUATOR (MISCELLANEOUS) IMPLANT
SOLUTION SCRB POV-IOD 4OZ 7.5% (MISCELLANEOUS) ×1 IMPLANT
SPIKE FLUID TRANSFER (MISCELLANEOUS) ×1 IMPLANT
SURGILUBE 2OZ TUBE FLIPTOP (MISCELLANEOUS) ×1 IMPLANT
SUT CHROMIC 2 0 SH (SUTURE) IMPLANT
SUT CHROMIC 3 0 SH 27 (SUTURE) IMPLANT
SUT MON AB 3-0 SH27 (SUTURE) IMPLANT
SUT VIC AB 3-0 SH 27XBRD (SUTURE) IMPLANT
SUT VIC AB 4-0 PS2 18 (SUTURE) IMPLANT
SYR 20ML LL LF (SYRINGE) IMPLANT
SYR CONTROL 10ML LL (SYRINGE) ×1 IMPLANT
TOWEL OR DSP ST BLU DLX 10/PK (DISPOSABLE) ×1 IMPLANT
YANKAUER SUCT BULB TIP 10FT TU (MISCELLANEOUS) ×1 IMPLANT

## 2024-10-18 NOTE — Discharge Instructions (Addendum)

## 2024-10-18 NOTE — Anesthesia Procedure Notes (Signed)
 Procedure Name: Intubation Date/Time: 10/18/2024 7:35 AM  Performed by: Obadiah Reyes BROCKS, CRNAPre-anesthesia Checklist: Patient identified, Emergency Drugs available, Suction available and Patient being monitored Patient Re-evaluated:Patient Re-evaluated prior to induction Oxygen Delivery Method: Circle System Utilized Preoxygenation: Pre-oxygenation with 100% oxygen Induction Type: IV induction Ventilation: Mask ventilation without difficulty Laryngoscope Size: Miller and 2 Grade View: Grade II Tube type: Oral Number of attempts: 1 Airway Equipment and Method: Stylet and Oral airway Placement Confirmation: ETT inserted through vocal cords under direct vision, positive ETCO2 and breath sounds checked- equal and bilateral Secured at: 23 cm Tube secured with: Tape Dental Injury: Teeth and Oropharynx as per pre-operative assessment

## 2024-10-18 NOTE — H&P (Signed)
 PROVIDER:  BERNARDA WANDA NED, MD   MRN: [id number] DOB: Dec 17, 1980  Subjective    C History of Present Illness: Johnathan Lee is a 43 y.o. male who is seen today for anal fistula.  Patient is status post posterior midline fistulotomy September 2023.  He recently developed some recurrent pain and is here today for evaluation.  He also complains of some intermittent abdominal pain but denies any diarrhea, since starting Zepbound.       Past Medical History:  Diagnosis Date   Asthma     Former smoker     Hypertension     Obesity     Osteoarthritis     Sleep apnea                 Past Surgical History:  Procedure Laterality Date   COLONOSCOPY N/A 05/22/2024    Procedure: COLONOSCOPY;  Surgeon: Rosalie Kitchens, MD;  Location: [facility];  Service: Gastroenterology;  Laterality: N/A;   FISTULOTOMY N/A 07/01/2022    Procedure: FISTULOTOMY;  Surgeon: Ned Bernarda, MD;  Location: [facility];  Service: General;  Laterality: N/A;   NO PAST SURGERIES       RECTAL EXAM UNDER ANESTHESIA N/A 07/01/2022    Procedure: RECTAL EXAM UNDER ANESTHESIA;  Surgeon: Ned Bernarda, MD;  Location: [facility];  Service: General;  Laterality: N/A;          No family history on file.       Social History         Socioeconomic History   Marital status: Married      Spouse name: Not on file   Number of children: Not on file   Years of education: Not on file   Highest education level: Not on file  Occupational History   Not on file  Tobacco Use   Smoking status: Former      Current packs/day: 0.00      Average packs/day: 0.5 packs/day for 20.0 years (10.0 ttl pk-yrs)      Types: Cigarettes, Cigars      Start date: 04/26/2002      Quit date: 04/26/2022      Years since quitting: 2.3   Smokeless tobacco: Never  Vaping Use   Vaping status: Never Used  Substance and Sexual Activity   Alcohol use: Not Currently   Drug use: Never   Sexual activity: Not on file  Other Topics Concern   Not on  file  Social History Narrative   Not on file    Social Drivers of Health        Financial Resource Strain: Low Risk  (03/01/2023)    Received from [facility]    Overall Financial Resource Strain (CARDIA)     Difficulty of Paying Living Expenses: Not very hard  Food Insecurity: No Food Insecurity (03/01/2023)    Received from [facility]    Hunger Vital Sign     Within the past 12 months, you worried that your food would run out before you got the money to buy more.: Never true     Within the past 12 months, the food you bought just didn't last and you didn't have money to get more.: Never true  Transportation Needs: No Transportation Needs (03/01/2023)    Received from [facility] - Transportation     Lack of Transportation (Medical): No     Lack of Transportation (Non-Medical): No  Physical Activity: Unknown (03/01/2023)    Received from [facility]  Exercise Vital Sign     On average, how many days per week do you engage in moderate to strenuous exercise (like a brisk walk)?: 0 days     Minutes of Exercise per Session: Not on file  Stress: Stress Concern Present (03/01/2023)    Received from [facility] - Occupational Stress Questionnaire     Feeling of Stress : Rather much  Social Connections: Somewhat Isolated (03/01/2023)    Received from [facility]    Social Network     How would you rate your social network (family, work, friends)?: Restricted participation with some degree of social isolation  Intimate Partner Violence: Not At Risk (03/01/2023)    Received from [facility]    HITS     Over the last 12 months how often did your partner physically hurt you?: Never     Over the last 12 months how often did your partner insult you or talk down to you?: Never     Over the last 12 months how often did your partner threaten you with physical harm?: Never     Over the last 12 months how often did your partner  scream or curse at you?: Never      Current Medications    Current Outpatient Medications:    albuterol  (VENTOLIN  HFA) 108 (90 Base) MCG/ACT inhaler, Inhale 2 puffs into the lungs every 4 (four) hours as needed for wheezing or shortness of breath., Disp: 18 g, Rfl: 0   amLODipine  (NORVASC ) 5 MG tablet, Take 5 mg by mouth daily., Disp: , Rfl:    buPROPion (WELLBUTRIN SR) 150 MG 12 hr tablet, Take 150 mg by mouth 2 (two) times daily., Disp: , Rfl:    carvedilol (COREG) 6.25 MG tablet, Take 6.25 mg by mouth 2 (two) times daily with a meal., Disp: , Rfl:    cephALEXin  (KEFLEX ) 500 MG capsule, Take 1 capsule (500 mg total) by mouth 4 (four) times daily., Disp: 28 capsule, Rfl: 0   chlorthalidone (HYGROTON) 25 MG tablet, Take 25 mg by mouth every morning., Disp: , Rfl:    hydrochlorothiazide  (HYDRODIURIL ) 25 MG tablet, TAKE 1 TABLET BY MOUTH ONCE A DAY IN THE MORNING (Patient not taking: Reported on 09/20/2022), Disp: 30 tablet, Rfl: 1   ibuprofen  (ADVIL ) 200 MG tablet, Take 200 mg by mouth every 6 (six) hours as needed for mild pain (pain)., Disp: , Rfl:    ibuprofen  (ADVIL ) 400 MG tablet, Take 1 tablet (400 mg total) by mouth every 4 (four) hours as needed for mild pain. (Patient not taking: Reported on 06/21/2022), Disp: 30 tablet, Rfl: 0   losartan (COZAAR) 50 MG tablet, Take 50 mg by mouth daily., Disp: , Rfl:       Allergies  No Known Allergies     Review of Systems - Negative except as stated above        Objective:    Vitals:   10/18/24 0543  BP: (!) 155/86  Pulse: 76  Resp: 18  Temp: 98.2 F (36.8 C)  SpO2: 96%    Gen: NAD CV: RRR Pulm:CTA Rectal: Punctate lesion posterior.  Fistula probe inserts approximately 5 mm.   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   He has a history of a previous fistula with fistulotomy.  MRI was obtained.  This showed a posterior fistula at his previous  fistulotomy site.  I recommended repeat fistulotomy.  We discussed using a fiber supplement afterwards to keep the area open while it heals.  Generally there is a 2% risk of recurrence.  Other risks include bleeding and pain.          Bernarda JAYSON Ned, MD Colon and Rectal Surgery [facility]

## 2024-10-18 NOTE — Op Note (Signed)
 10/18/2024  8:10 AM  PATIENT:  Johnathan Lee  43 y.o. male  Patient Care Team: Stacia Millman, PA as PCP - General (Physician Assistant)  PRE-OPERATIVE DIAGNOSIS:  RECURRENT ANAL FISTULA  POST-OPERATIVE DIAGNOSIS:  RECURRENT ANAL FISTULA  PROCEDURE:  ANAL FISTULOTOMY    Surgeon(s): Debby Hila, MD  ASSISTANT: none   ANESTHESIA:   local and general  SPECIMEN:  No Specimen  DISPOSITION OF SPECIMEN:  N/A  COUNTS:  YES  PLAN OF CARE: Discharge to home after PACU  PATIENT DISPOSITION:  PACU - hemodynamically stable.  INDICATION: 43 y.o. M with h/o fistulotomy who presented with recurrent symptoms.  MRI confirmed recurrent fistula without any complex features.   OR FINDINGS: L posterior fistula with overlying scar  DESCRIPTION: the patient was identified in the preoperative holding area and taken to the OR where they were laid on the operating room table.  General anesthesia was induced without difficulty. The patient was then positioned in prone jackknife position with buttocks gently taped apart.  The patient was then prepped and draped in usual sterile fashion.  SCDs were noted to be in place prior to the initiation of anesthesia. A surgical timeout was performed indicating the correct patient, procedure, positioning and need for preoperative antibiotics.  A rectal block was performed using Marcaine  with epinephrine .    I began with a digital rectal exam.  The anal canal was gently dilated.  I then placed a Hill-Ferguson anoscope into the anal canal and evaluated this completely.  A fistula probe was inserted into the external opening.  I was easily able to pass the probe through to an internal opening.  A fistulotomy was performed using cautery.  There was scar only overlying the fistula.  I then marsupialized the edges using a 3-0 Vicryl suture.  Hemostasis was good.  A dressing was applied.  The patient was then awakened from anesthesia and sent to the PACU in stable  condition.  All counts were correct per OR staff.     Hila [name], MD  Colorectal and General Surgery [facility]

## 2024-10-18 NOTE — Anesthesia Preprocedure Evaluation (Signed)
 Anesthesia Evaluation  Patient identified by MRN, date of birth, ID band Patient awake    Reviewed: Allergy & Precautions, H&P , NPO status , Patient's Chart, lab work & pertinent test results  Airway Mallampati: II  TM Distance: >3 FB Neck ROM: Full    Dental  (+) Dental Advisory Given   Pulmonary sleep apnea , former smoker   Pulmonary exam normal breath sounds clear to auscultation       Cardiovascular hypertension, Pt. on medications negative cardio ROS Normal cardiovascular exam Rhythm:Regular Rate:Normal     Neuro/Psych negative neurological ROS  negative psych ROS   GI/Hepatic negative GI ROS, Neg liver ROS,,,  Endo/Other    Class 3 obesity  Renal/GU negative Renal ROS  negative genitourinary   Musculoskeletal negative musculoskeletal ROS (+)    Abdominal  (+) + obese  Peds negative pediatric ROS (+)  Hematology negative hematology ROS (+)   Anesthesia Other Findings   Reproductive/Obstetrics negative OB ROS                              Anesthesia Physical Anesthesia Plan  ASA: 3  Anesthesia Plan: General   Post-op Pain Management:    Induction: Intravenous  PONV Risk Score and Plan: 2 and Ondansetron , Midazolam  and Treatment may vary due to age or medical condition  Airway Management Planned: Oral ETT  Additional Equipment:   Intra-op Plan:   Post-operative Plan: Extubation in OR  Informed Consent: I have reviewed the patients History and Physical, chart, labs and discussed the procedure including the risks, benefits and alternatives for the proposed anesthesia with the patient or authorized representative who has indicated his/her understanding and acceptance.     Dental advisory given  Plan Discussed with: CRNA  Anesthesia Plan Comments:         Anesthesia Quick Evaluation

## 2024-10-18 NOTE — Anesthesia Postprocedure Evaluation (Signed)
 Anesthesia Post Note  Patient: Johnathan Lee  Procedure(s) Performed: ANAL FISTULOTOMY     Patient location during evaluation: PACU Anesthesia Type: General Level of consciousness: awake and alert Pain management: pain level controlled Vital Signs Assessment: post-procedure vital signs reviewed and stable Respiratory status: spontaneous breathing, nonlabored ventilation and respiratory function stable Cardiovascular status: blood pressure returned to baseline and stable Postop Assessment: no apparent nausea or vomiting Anesthetic complications: no   No notable events documented.  Last Vitals:  Vitals:   10/18/24 0945 10/18/24 0956  BP: (!) 143/97 (!) 146/91  Pulse: 73 71  Resp: (!) 22 17  Temp: (!) 36.4 C 36.5 C  SpO2: 93% 97%    Last Pain:  Vitals:   10/18/24 0956  TempSrc:   PainSc: 2                  Butler Levander Pinal

## 2024-10-18 NOTE — Transfer of Care (Signed)
 Immediate Anesthesia Transfer of Care Note  Patient: Zebulun Deman  Procedure(s) Performed: ANAL FISTULOTOMY  Patient Location: PACU  Anesthesia Type:General  Level of Consciousness: awake, alert , and oriented  Airway & Oxygen Therapy: Patient Spontanous Breathing and Patient connected to face mask oxygen  Post-op Assessment: Report given to RN and Post -op Vital signs reviewed and unstable, Anesthesiologist notified  Post vital signs: Reviewed and stable  Last Vitals:  Vitals Value Taken Time  BP 134/88 10/18/24 08:34  Temp    Pulse 82 10/18/24 08:39  Resp 19 10/18/24 08:39  SpO2 94 % 10/18/24 08:39  Vitals shown include unfiled device data.  Last Pain:  Vitals:   10/18/24 0601  TempSrc:   PainSc: 0-No pain         Complications: No notable events documented.

## 2024-10-19 ENCOUNTER — Encounter (HOSPITAL_COMMUNITY): Payer: Self-pay | Admitting: General Surgery
